# Patient Record
Sex: Female | Born: 1960 | Race: White | Hispanic: No | Marital: Married | Smoking: Current every day smoker
Health system: Southern US, Community
[De-identification: ages and names within clinical notes are randomized; demographics above are authoritative.]

## PROBLEM LIST (undated history)

## (undated) DIAGNOSIS — C801 Malignant (primary) neoplasm, unspecified: Secondary | ICD-10-CM

## (undated) DIAGNOSIS — I10 Essential (primary) hypertension: Secondary | ICD-10-CM

## (undated) DIAGNOSIS — K859 Acute pancreatitis without necrosis or infection, unspecified: Secondary | ICD-10-CM

## (undated) HISTORY — PX: SMALL INTESTINE SURGERY: SHX150

## (undated) HISTORY — PX: CHOLECYSTECTOMY: SHX55

## (undated) HISTORY — PX: ABDOMINAL SURGERY: SHX537

## (undated) HISTORY — PX: ABDOMINAL HYSTERECTOMY: SHX81

---

## 2010-01-25 ENCOUNTER — Emergency Department (HOSPITAL_COMMUNITY): Admission: EM | Admit: 2010-01-25 | Discharge: 2010-01-25 | Payer: Self-pay | Admitting: Emergency Medicine

## 2010-07-09 ENCOUNTER — Emergency Department (HOSPITAL_COMMUNITY)
Admission: EM | Admit: 2010-07-09 | Discharge: 2010-07-10 | Payer: Self-pay | Source: Home / Self Care | Admitting: Emergency Medicine

## 2012-04-10 ENCOUNTER — Other Ambulatory Visit (HOSPITAL_COMMUNITY): Payer: Self-pay | Admitting: Family Medicine

## 2012-04-10 DIAGNOSIS — M79606 Pain in leg, unspecified: Secondary | ICD-10-CM

## 2012-04-10 DIAGNOSIS — M545 Low back pain: Secondary | ICD-10-CM

## 2012-04-10 DIAGNOSIS — R32 Unspecified urinary incontinence: Secondary | ICD-10-CM

## 2012-04-17 ENCOUNTER — Ambulatory Visit (HOSPITAL_COMMUNITY)
Admission: RE | Admit: 2012-04-17 | Discharge: 2012-04-17 | Disposition: A | Discharge status: Home / Self Care | Payer: Medicaid Other | Source: Ambulatory Visit | Attending: Family Medicine | Admitting: Family Medicine

## 2012-04-17 DIAGNOSIS — M5126 Other intervertebral disc displacement, lumbar region: Secondary | ICD-10-CM | POA: Insufficient documentation

## 2012-04-17 DIAGNOSIS — R209 Unspecified disturbances of skin sensation: Secondary | ICD-10-CM | POA: Insufficient documentation

## 2012-04-17 DIAGNOSIS — R32 Unspecified urinary incontinence: Secondary | ICD-10-CM

## 2012-04-17 DIAGNOSIS — M545 Low back pain, unspecified: Secondary | ICD-10-CM | POA: Insufficient documentation

## 2012-04-17 DIAGNOSIS — M79606 Pain in leg, unspecified: Secondary | ICD-10-CM

## 2016-02-22 ENCOUNTER — Emergency Department (HOSPITAL_COMMUNITY)
Admission: EM | Admit: 2016-02-22 | Discharge: 2016-02-22 | Disposition: A | Discharge status: Home / Self Care | Payer: BLUE CROSS/BLUE SHIELD | Attending: Emergency Medicine | Admitting: Emergency Medicine

## 2016-02-22 ENCOUNTER — Encounter (HOSPITAL_COMMUNITY): Payer: Self-pay

## 2016-02-22 DIAGNOSIS — G8929 Other chronic pain: Secondary | ICD-10-CM | POA: Insufficient documentation

## 2016-02-22 DIAGNOSIS — S29012A Strain of muscle and tendon of back wall of thorax, initial encounter: Secondary | ICD-10-CM | POA: Insufficient documentation

## 2016-02-22 DIAGNOSIS — Z859 Personal history of malignant neoplasm, unspecified: Secondary | ICD-10-CM | POA: Insufficient documentation

## 2016-02-22 DIAGNOSIS — I10 Essential (primary) hypertension: Secondary | ICD-10-CM | POA: Insufficient documentation

## 2016-02-22 DIAGNOSIS — Y9289 Other specified places as the place of occurrence of the external cause: Secondary | ICD-10-CM | POA: Insufficient documentation

## 2016-02-22 DIAGNOSIS — S199XXA Unspecified injury of neck, initial encounter: Secondary | ICD-10-CM | POA: Diagnosis present

## 2016-02-22 DIAGNOSIS — S46811A Strain of other muscles, fascia and tendons at shoulder and upper arm level, right arm, initial encounter: Secondary | ICD-10-CM

## 2016-02-22 DIAGNOSIS — M5412 Radiculopathy, cervical region: Secondary | ICD-10-CM

## 2016-02-22 DIAGNOSIS — Y998 Other external cause status: Secondary | ICD-10-CM | POA: Insufficient documentation

## 2016-02-22 DIAGNOSIS — S0990XA Unspecified injury of head, initial encounter: Secondary | ICD-10-CM | POA: Insufficient documentation

## 2016-02-22 DIAGNOSIS — W11XXXA Fall on and from ladder, initial encounter: Secondary | ICD-10-CM | POA: Insufficient documentation

## 2016-02-22 DIAGNOSIS — Z79899 Other long term (current) drug therapy: Secondary | ICD-10-CM | POA: Diagnosis not present

## 2016-02-22 DIAGNOSIS — Y9389 Activity, other specified: Secondary | ICD-10-CM | POA: Insufficient documentation

## 2016-02-22 DIAGNOSIS — Z79818 Long term (current) use of other agents affecting estrogen receptors and estrogen levels: Secondary | ICD-10-CM | POA: Insufficient documentation

## 2016-02-22 DIAGNOSIS — S4991XA Unspecified injury of right shoulder and upper arm, initial encounter: Secondary | ICD-10-CM | POA: Insufficient documentation

## 2016-02-22 HISTORY — DX: Essential (primary) hypertension: I10

## 2016-02-22 HISTORY — DX: Malignant (primary) neoplasm, unspecified: C80.1

## 2016-02-22 MED ORDER — METHOCARBAMOL 500 MG PO TABS
1000.0000 mg | ORAL_TABLET | Freq: Once | ORAL | Status: AC
Start: 2016-02-22 — End: 2016-02-22
  Administered 2016-02-22: 1000 mg via ORAL
  Filled 2016-02-22: qty 2

## 2016-02-22 MED ORDER — METHOCARBAMOL 1000 MG/10ML IJ SOLN: 1000.0000 mg | Freq: Once | INTRAMUSCULAR | Status: DC

## 2016-02-22 MED ORDER — KETOROLAC TROMETHAMINE 60 MG/2ML IM SOLN
60.0000 mg | Freq: Once | INTRAMUSCULAR | Status: AC
  Administered 2016-02-22: 60 mg via INTRAMUSCULAR
  Filled 2016-02-22: qty 2

## 2016-02-22 MED ORDER — IBUPROFEN 800 MG PO TABS: 800.0000 mg | ORAL_TABLET | Freq: Four times a day (QID) | ORAL | Status: DC | PRN

## 2016-02-22 MED ORDER — METHOCARBAMOL 500 MG PO TABS: 500.0000 mg | ORAL_TABLET | Freq: Four times a day (QID) | ORAL | Status: DC | PRN

## 2016-02-22 NOTE — ED Provider Notes (Signed)
Patient reports she slipped on a attic ladder 2 days ago falling down 3 steps she complained of posterior neck pain onset several hours after the event. She feels much improved since treatment here and has no pain and feels that muscle spasm in her neck has resolved here on exam patient is alert no distress HEENT exam no facial asymmetry neck supple no tenderness neurologic motor strength 5 over 5 overall all 4 extremities with full range of motion  Orlie Dakin, MD 02/22/16 2127

## 2016-02-22 NOTE — ED Notes (Signed)
Dr. Ronnald Ramp at the bedside.

## 2016-02-22 NOTE — ED Notes (Signed)
Patient here with neck pain after slipping while coming out of attic, caught by spouse. Complains of posterior neck pain

## 2016-02-22 NOTE — ED Provider Notes (Signed)
CSN: [id number]     Arrival date & time 02/22/16  1413 History   First MD Initiated Contact with Patient 02/22/16 1911     Chief Complaint  Patient presents with  . Neck Pain     (Consider location/radiation/quality/duration/timing/severity/associated sxs/prior Treatment) HPI 55 y.o. female with a hx of chronic low back pain due to DDD and known mild cervical radiculopathy presents to the ED noting a recent slip down a few steps of a ladder 2 days ago. She denies actually falling to the ground, hitting her head, LOC, nor amnesia. She states that she grabbed the ladder suddenly and was able to brace herself when she slipped preventing a fall. She states that insidiously after this, however she began having right sided neck pain in the distribution of her trapezius. She takes chronic opioid pain medication and took this as rx'd, but had no significant improvement in her sx. Movement of her neck and right arm, and palpation makes it worse. She denies any associated radiculopathy, or focal neurologic deficits with this. She denies any midline pain in her C/T.L spine. She states that she still has her chronic low back pain but denies any worsening of it. She denies any other injuries associated with her fall.   Past Medical History  Diagnosis Date  . Hypertension   . Cancer [facility])    Past Surgical History  Procedure Laterality Date  . Abdominal hysterectomy     No family history on file. Social History  Substance Use Topics  . Smoking status: Never Smoker   . Smokeless tobacco: None  . Alcohol Use: None   OB History    No data available     Review of Systems  Constitutional: Positive for activity change. Negative for fever, chills and appetite change.  HENT: Negative for congestion and rhinorrhea.   Eyes: Negative for photophobia.  Respiratory: Negative for cough, chest tightness and shortness of breath.   Cardiovascular: Negative for chest pain.  Gastrointestinal: Negative for  nausea, vomiting, abdominal pain and diarrhea.  Genitourinary: Negative for dysuria, urgency, frequency, flank pain, enuresis and pelvic pain.  Musculoskeletal: Positive for back pain, neck pain and neck stiffness. Negative for myalgias and gait problem.  Skin: Negative for rash and wound.  Neurological: Positive for headaches. Negative for seizures, syncope, weakness, light-headedness and numbness.  All other systems reviewed and are negative.     Allergies  Bactrim  Home Medications   Prior to Admission medications   Medication Sig Start Date End Date Taking? Authorizing Provider  albuterol (PROVENTIL HFA;VENTOLIN HFA) 108 (90 Base) MCG/ACT inhaler Inhale 2-3 puffs into the lungs every 6 (six) hours as needed for wheezing or shortness of breath.   Yes Historical Provider, MD  ALPRAZolam Duanne Moron) 1 MG tablet Take 1 mg by mouth at bedtime as needed for anxiety.   Yes Historical Provider, MD  estradiol (ESTRACE) 0.5 MG tablet Take 0.5 mg by mouth daily.   Yes Historical Provider, MD  FLUoxetine (PROZAC) 20 MG capsule Take 20 mg by mouth daily.   Yes Historical Provider, MD  gabapentin (NEURONTIN) 300 MG capsule Take 300 mg by mouth 3 (three) times daily.   Yes Historical Provider, MD  lisinopril (PRINIVIL,ZESTRIL) 10 MG tablet Take 10 mg by mouth daily.   Yes Historical Provider, MD  oxyCODONE-acetaminophen (PERCOCET) 7.5-325 MG tablet Take 1 tablet by mouth 3 (three) times daily. Take every day per patient   Yes Historical Provider, MD  simvastatin (ZOCOR) 20 MG tablet Take 20 mg  by mouth every evening.   Yes Historical Provider, MD  varenicline (CHANTIX) 1 MG tablet Take 1 mg by mouth 2 (two) times daily.   Yes Historical Provider, MD  ibuprofen (ADVIL,MOTRIN) 800 MG tablet Take 1 tablet (800 mg total) by mouth every 6 (six) hours as needed for moderate pain. 02/22/16   Zenovia Jarred, DO  methocarbamol (ROBAXIN) 500 MG tablet Take 1 tablet (500 mg total) by mouth every 6 (six) hours as  needed for muscle spasms. 02/22/16   Zenovia Jarred, DO   BP 137/80 mmHg  Pulse 60  Temp(Src) 98 F (36.7 C) (Oral)  Resp 20  Ht 5\' 4"  (1.626 m)  Wt 73.936 kg  BMI 27.97 kg/m2  SpO2 97% Physical Exam  Constitutional: She is oriented to person, place, and time. She appears well-developed and well-nourished. No distress.  HENT:  Head: Normocephalic and atraumatic.  Nose: Nose normal.  Mouth/Throat: Oropharynx is clear and moist.  Eyes: Conjunctivae and EOM are normal. Pupils are equal, round, and reactive to light.  Neck: Normal range of motion. Neck supple. Muscular tenderness present. No spinous process tenderness present. Normal range of motion present.  + spurlings with radicular sx down her right arm, chronic. With old report of opt MRI at bedside showing mild foraminal stenosis in mid-cervical spine.   Cardiovascular: Normal rate, regular rhythm, normal heart sounds and intact distal pulses.   Pulmonary/Chest: Effort normal and breath sounds normal. She exhibits no tenderness.  Abdominal: Soft. There is no tenderness.  Musculoskeletal: She exhibits tenderness. She exhibits no edema.       Right shoulder: She exhibits tenderness and spasm. She exhibits no bony tenderness.       Arms: No C/T/L spine midline tenderness, crepitus or deformity.  Neurological: She is alert and oriented to person, place, and time. She has normal strength. No cranial nerve deficit or sensory deficit. Coordination and gait normal. GCS eye subscore is 4. GCS verbal subscore is 5. GCS motor subscore is 6.  Skin: Skin is warm and dry. No rash noted. She is not diaphoretic.  Nursing note and vitals reviewed.   ED Course  Procedures (including critical care time) Labs Review Labs Reviewed - No data to display  Imaging Review No results found. I have personally reviewed and evaluated these images and lab results as part of my medical decision-making.   EKG Interpretation None      MDM  55 y.o.  wirth hx of DDD, with known mild cervical stenosis presents to the ED noting right trapezius pain after she slipped down a few steps on a ladder 2 days with pain unalleviated by her chronic opiate pain medications for her chronic LBP. Physical exam reassuring with VSS, NAD. Tender to palpation in the distribution of the right trapezius with palpable muscle spasm compared to contralateral side. +spurlings on right. No focal neurologic deficit without axial loading of the c-spine however. NVI distal. No midline tenderness. Given insidious onset of pain a few hours after the fall with no bony or acute  neurologic deficits feel that her sx are likely due to a strain of her trapezius sustained when she slipped and caught herself before falling. Do not feel that advanced imaging is necessary at this time given reassuring exam, no hx of direct trauma, and recent MRI showing evidence of spinal stenosis to explain radiculopathy. She was treated in the ED with toradol and robaxin and had significant improvement in her sx. She was rx'd ibuprofen and robaxin  and was recommended to follow up with her PCP in about a week to monitor for improvement in her sx. This plan was discussed with the patient at the bedside and she stated both understanding and agreement with this plan.   Final diagnoses:  Trapezius strain, right, initial encounter  Cervical radiculopathy, chronic       Zenovia Jarred, DO 02/23/16 1242  Orlie Dakin, MD 02/24/16 [id number]

## 2016-02-22 NOTE — Discharge Instructions (Signed)
Cervical Radiculopathy Cervical radiculopathy means that a nerve in the neck is pinched or bruised. This can cause pain or loss of feeling (numbness) that runs from your neck to your arm and fingers. HOME CARE Managing Pain  Take over-the-counter and prescription medicines only as told by your doctor.  If directed, put ice on the injured or painful area.  Put ice in a plastic bag.  Place a towel between your skin and the bag.  Leave the ice on for 20 minutes, 2-3 times per day.  If ice does not help, you can try using heat. Take a warm shower or warm bath, or use a heat pack as told by your doctor.  You may try a gentle neck and shoulder massage. Activity  Rest as needed. Follow instructions from your doctor about any activities to avoid.  Do exercises as told by your doctor or physical therapist. General Instructions   If you were given a soft collar, wear it as told by your doctor.  Use a flat pillow when you sleep.  Keep all follow-up visits as told by your doctor. This is important. GET HELP IF:  Your condition does not improve with treatment. GET HELP RIGHT AWAY IF:   Your pain gets worse and is not controlled with medicine.  You lose feeling or feel weak in your hand, arm, face, or leg.  You have a fever.  You have a stiff neck.  You cannot control when you poop or pee (have incontinence).  You have trouble with walking, balance, or talking.   This information is not intended to replace advice given to you by your health care provider. Make sure you discuss any questions you have with your health care provider.   Document Released: 10/17/2011 Document Revised: 07/19/2015 Document Reviewed: 12/22/2014 Elsevier Interactive Patient Education 2016 [employer]  

## 2017-01-04 ENCOUNTER — Inpatient Hospital Stay (HOSPITAL_COMMUNITY)
Admission: AD | Admit: 2017-01-04 | Discharge: 2017-01-07 | DRG: 440 | Disposition: A | Discharge status: Home / Self Care | Payer: Medicaid Other | Source: Other Acute Inpatient Hospital | Attending: Internal Medicine | Admitting: Internal Medicine

## 2017-01-04 ENCOUNTER — Telehealth: Payer: Self-pay | Admitting: Internal Medicine

## 2017-01-04 DIAGNOSIS — R0600 Dyspnea, unspecified: Secondary | ICD-10-CM

## 2017-01-04 DIAGNOSIS — E876 Hypokalemia: Secondary | ICD-10-CM | POA: Diagnosis present

## 2017-01-04 DIAGNOSIS — J449 Chronic obstructive pulmonary disease, unspecified: Secondary | ICD-10-CM | POA: Diagnosis present

## 2017-01-04 DIAGNOSIS — K219 Gastro-esophageal reflux disease without esophagitis: Secondary | ICD-10-CM | POA: Diagnosis present

## 2017-01-04 DIAGNOSIS — K838 Other specified diseases of biliary tract: Secondary | ICD-10-CM

## 2017-01-04 DIAGNOSIS — Z90710 Acquired absence of both cervix and uterus: Secondary | ICD-10-CM

## 2017-01-04 DIAGNOSIS — Z87891 Personal history of nicotine dependence: Secondary | ICD-10-CM

## 2017-01-04 DIAGNOSIS — K851 Biliary acute pancreatitis without necrosis or infection: Secondary | ICD-10-CM | POA: Diagnosis present

## 2017-01-04 DIAGNOSIS — I10 Essential (primary) hypertension: Secondary | ICD-10-CM | POA: Diagnosis present

## 2017-01-04 DIAGNOSIS — R7989 Other specified abnormal findings of blood chemistry: Secondary | ICD-10-CM | POA: Diagnosis not present

## 2017-01-04 DIAGNOSIS — E785 Hyperlipidemia, unspecified: Secondary | ICD-10-CM | POA: Diagnosis present

## 2017-01-04 DIAGNOSIS — K859 Acute pancreatitis without necrosis or infection, unspecified: Secondary | ICD-10-CM | POA: Diagnosis present

## 2017-01-04 DIAGNOSIS — F418 Other specified anxiety disorders: Secondary | ICD-10-CM | POA: Diagnosis present

## 2017-01-04 DIAGNOSIS — R112 Nausea with vomiting, unspecified: Secondary | ICD-10-CM | POA: Diagnosis present

## 2017-01-04 DIAGNOSIS — Z9049 Acquired absence of other specified parts of digestive tract: Secondary | ICD-10-CM | POA: Diagnosis not present

## 2017-01-04 DIAGNOSIS — R748 Abnormal levels of other serum enzymes: Secondary | ICD-10-CM | POA: Diagnosis not present

## 2017-01-04 NOTE — Telephone Encounter (Signed)
Called by Dr. Colin Rhein at [facility] ED.  56 yo F with h/o cholecystectomy.  CT abd pelvis showed no stone nor bile duct dilation.  Lipase is 1042, AST 160, ALT 223, ALK 199.  Suspect retained gallstone causing pancreatitis?  Not a drinker.  Vital signs BP 116/67, pulse 63, patient going to med surg inpatient.  EDP spoke with Dr. Simona Huh with [facility] who will see patient in consult (presumably in AM).  No fever, chills, WBC 4.6.

## 2017-01-05 ENCOUNTER — Inpatient Hospital Stay (HOSPITAL_COMMUNITY): Payer: Medicaid Other

## 2017-01-05 ENCOUNTER — Encounter (HOSPITAL_COMMUNITY): Payer: Self-pay | Admitting: Family Medicine

## 2017-01-05 DIAGNOSIS — K859 Acute pancreatitis without necrosis or infection, unspecified: Secondary | ICD-10-CM | POA: Diagnosis present

## 2017-01-05 DIAGNOSIS — E876 Hypokalemia: Secondary | ICD-10-CM | POA: Diagnosis present

## 2017-01-05 DIAGNOSIS — K219 Gastro-esophageal reflux disease without esophagitis: Secondary | ICD-10-CM

## 2017-01-05 DIAGNOSIS — E785 Hyperlipidemia, unspecified: Secondary | ICD-10-CM

## 2017-01-05 DIAGNOSIS — J449 Chronic obstructive pulmonary disease, unspecified: Secondary | ICD-10-CM | POA: Diagnosis present

## 2017-01-05 DIAGNOSIS — F418 Other specified anxiety disorders: Secondary | ICD-10-CM | POA: Diagnosis present

## 2017-01-05 DIAGNOSIS — I10 Essential (primary) hypertension: Secondary | ICD-10-CM | POA: Diagnosis present

## 2017-01-05 DIAGNOSIS — R7989 Other specified abnormal findings of blood chemistry: Secondary | ICD-10-CM

## 2017-01-05 HISTORY — DX: Chronic obstructive pulmonary disease, unspecified: J44.9

## 2017-01-05 HISTORY — DX: Other specified anxiety disorders: F41.8

## 2017-01-05 HISTORY — DX: Gastro-esophageal reflux disease without esophagitis: K21.9

## 2017-01-05 HISTORY — DX: Hyperlipidemia, unspecified: E78.5

## 2017-01-05 LAB — LIPASE, BLOOD: LIPASE: 66 U/L — AB (ref 11–51)

## 2017-01-05 LAB — COMPREHENSIVE METABOLIC PANEL
ALK PHOS: 168 U/L — AB (ref 38–126)
ALT: 172 U/L — AB (ref 14–54)
ANION GAP: 9 (ref 5–15)
AST: 113 U/L — ABNORMAL HIGH (ref 15–41)
Albumin: 3.2 g/dL — ABNORMAL LOW (ref 3.5–5.0)
BUN: 6 mg/dL (ref 6–20)
CALCIUM: 8.3 mg/dL — AB (ref 8.9–10.3)
CO2: 26 mmol/L (ref 22–32)
CREATININE: 0.74 mg/dL (ref 0.44–1.00)
Chloride: 104 mmol/L (ref 101–111)
Glucose, Bld: 104 mg/dL — ABNORMAL HIGH (ref 65–99)
Potassium: 3.7 mmol/L (ref 3.5–5.1)
Sodium: 139 mmol/L (ref 135–145)
Total Bilirubin: 1.6 mg/dL — ABNORMAL HIGH (ref 0.3–1.2)
Total Protein: 5.7 g/dL — ABNORMAL LOW (ref 6.5–8.1)

## 2017-01-05 LAB — CBC WITH DIFFERENTIAL/PLATELET
BASOS PCT: 0 %
Basophils Absolute: 0 10*3/uL (ref 0.0–0.1)
EOS ABS: 0.2 10*3/uL (ref 0.0–0.7)
Eosinophils Relative: 3 %
HCT: 37.8 % (ref 36.0–46.0)
HEMOGLOBIN: 12.9 g/dL (ref 12.0–15.0)
Lymphocytes Relative: 22 %
Lymphs Abs: 1.3 10*3/uL (ref 0.7–4.0)
MCH: 31.2 pg (ref 26.0–34.0)
MCHC: 34.1 g/dL (ref 30.0–36.0)
MCV: 91.5 fL (ref 78.0–100.0)
Monocytes Absolute: 0.9 10*3/uL (ref 0.1–1.0)
Monocytes Relative: 15 %
NEUTROS PCT: 60 %
Neutro Abs: 3.6 10*3/uL (ref 1.7–7.7)
Platelets: 217 10*3/uL (ref 150–400)
RBC: 4.13 MIL/uL (ref 3.87–5.11)
RDW: 12.4 % (ref 11.5–15.5)
WBC: 5.9 10*3/uL (ref 4.0–10.5)

## 2017-01-05 LAB — TRIGLYCERIDES: Triglycerides: 68 mg/dL (ref <150)

## 2017-01-05 LAB — MAGNESIUM: MAGNESIUM: 2 mg/dL (ref 1.7–2.4)

## 2017-01-05 LAB — HIV ANTIBODY (ROUTINE TESTING W REFLEX): HIV Screen 4th Generation wRfx: NONREACTIVE

## 2017-01-05 MED ORDER — NICOTINE 14 MG/24HR TD PT24
14.0000 mg | MEDICATED_PATCH | Freq: Every day | TRANSDERMAL | Status: DC
  Administered 2017-01-05 – 2017-01-07 (×3): 14 mg via TRANSDERMAL
  Filled 2017-01-05 (×4): qty 1

## 2017-01-05 MED ORDER — ALUM & MAG HYDROXIDE-SIMETH 200-200-20 MG/5ML PO SUSP: 30.0000 mL | ORAL | Status: DC | PRN

## 2017-01-05 MED ORDER — ONDANSETRON HCL 4 MG PO TABS: 4.0000 mg | ORAL_TABLET | Freq: Four times a day (QID) | ORAL | Status: DC | PRN

## 2017-01-05 MED ORDER — MORPHINE SULFATE (PF) 2 MG/ML IV SOLN
2.0000 mg | INTRAVENOUS | Status: DC | PRN
  Administered 2017-01-05 (×2): 2 mg via INTRAVENOUS
  Administered 2017-01-05: 4 mg via INTRAVENOUS
  Administered 2017-01-06 (×2): 2 mg via INTRAVENOUS
  Filled 2017-01-05 (×3): qty 1
  Filled 2017-01-05: qty 2
  Filled 2017-01-05: qty 1

## 2017-01-05 MED ORDER — CALCIUM CARBONATE ANTACID 500 MG PO CHEW
1.0000 | CHEWABLE_TABLET | Freq: Once | ORAL | Status: AC
  Administered 2017-01-05: 200 mg via ORAL
  Filled 2017-01-05: qty 1

## 2017-01-05 MED ORDER — FLUOXETINE HCL 20 MG PO CAPS
20.0000 mg | ORAL_CAPSULE | Freq: Every day | ORAL | Status: DC
  Administered 2017-01-05 – 2017-01-07 (×3): 20 mg via ORAL
  Filled 2017-01-05 (×3): qty 1

## 2017-01-05 MED ORDER — ALBUTEROL SULFATE (2.5 MG/3ML) 0.083% IN NEBU: 3.0000 mL | INHALATION_SOLUTION | Freq: Four times a day (QID) | RESPIRATORY_TRACT | Status: DC | PRN

## 2017-01-05 MED ORDER — LISINOPRIL 10 MG PO TABS
10.0000 mg | ORAL_TABLET | Freq: Every day | ORAL | Status: DC
  Administered 2017-01-05: 10 mg via ORAL
  Filled 2017-01-05: qty 1

## 2017-01-05 MED ORDER — ACETAMINOPHEN 650 MG RE SUPP: 650.0000 mg | Freq: Four times a day (QID) | RECTAL | Status: DC | PRN

## 2017-01-05 MED ORDER — SODIUM CHLORIDE 0.9 % IV SOLN
INTRAVENOUS | Status: DC
  Administered 2017-01-05: 01:00:00 via INTRAVENOUS

## 2017-01-05 MED ORDER — ALUM & MAG HYDROXIDE-SIMETH 200-200-20 MG/5ML PO SUSP
15.0000 mL | Freq: Once | ORAL | Status: AC
  Administered 2017-01-05: 15 mL via ORAL
  Filled 2017-01-05: qty 30

## 2017-01-05 MED ORDER — ENOXAPARIN SODIUM 40 MG/0.4ML ~~LOC~~ SOLN
40.0000 mg | SUBCUTANEOUS | Status: DC
  Administered 2017-01-05 – 2017-01-07 (×3): 40 mg via SUBCUTANEOUS
  Filled 2017-01-05 (×4): qty 0.4

## 2017-01-05 MED ORDER — PROMETHAZINE HCL 25 MG/ML IJ SOLN
12.5000 mg | Freq: Four times a day (QID) | INTRAMUSCULAR | Status: DC | PRN
  Administered 2017-01-05: 12.5 mg via INTRAVENOUS
  Filled 2017-01-05: qty 1

## 2017-01-05 MED ORDER — OXYCODONE-ACETAMINOPHEN 7.5-325 MG PO TABS
1.0000 | ORAL_TABLET | Freq: Three times a day (TID) | ORAL | Status: DC | PRN
  Administered 2017-01-05 – 2017-01-06 (×4): 1 via ORAL
  Filled 2017-01-05 (×5): qty 1

## 2017-01-05 MED ORDER — ONDANSETRON HCL 4 MG/2ML IJ SOLN
4.0000 mg | Freq: Four times a day (QID) | INTRAMUSCULAR | Status: DC | PRN
  Administered 2017-01-05 – 2017-01-06 (×4): 4 mg via INTRAVENOUS
  Filled 2017-01-05 (×4): qty 2

## 2017-01-05 MED ORDER — GABAPENTIN 300 MG PO CAPS
300.0000 mg | ORAL_CAPSULE | Freq: Three times a day (TID) | ORAL | Status: DC
Start: 2017-01-05 — End: 2017-01-07
  Administered 2017-01-05 – 2017-01-07 (×7): 300 mg via ORAL
  Filled 2017-01-05 (×7): qty 1

## 2017-01-05 MED ORDER — ALPRAZOLAM 0.5 MG PO TABS: 1.0000 mg | ORAL_TABLET | Freq: Every evening | ORAL | Status: DC | PRN

## 2017-01-05 MED ORDER — GADOBENATE DIMEGLUMINE 529 MG/ML IV SOLN
15.0000 mL | Freq: Once | INTRAVENOUS | Status: AC | PRN
Start: 2017-01-05 — End: 2017-01-05
  Administered 2017-01-05: 15 mL via INTRAVENOUS

## 2017-01-05 MED ORDER — ACETAMINOPHEN 325 MG PO TABS
650.0000 mg | ORAL_TABLET | Freq: Four times a day (QID) | ORAL | Status: DC | PRN
  Administered 2017-01-05 – 2017-01-07 (×2): 650 mg via ORAL
  Filled 2017-01-05 (×2): qty 2

## 2017-01-05 MED ORDER — FAMOTIDINE IN NACL 20-0.9 MG/50ML-% IV SOLN
20.0000 mg | Freq: Two times a day (BID) | INTRAVENOUS | Status: DC
  Administered 2017-01-05 – 2017-01-06 (×4): 20 mg via INTRAVENOUS
  Filled 2017-01-05 (×5): qty 50

## 2017-01-05 MED ORDER — METHOCARBAMOL 500 MG PO TABS
500.0000 mg | ORAL_TABLET | Freq: Four times a day (QID) | ORAL | Status: DC | PRN
  Administered 2017-01-06 (×3): 500 mg via ORAL
  Filled 2017-01-05 (×3): qty 1

## 2017-01-05 MED ORDER — SODIUM CHLORIDE 0.9 % IV SOLN
INTRAVENOUS | Status: DC
  Administered 2017-01-05 – 2017-01-07 (×4): via INTRAVENOUS

## 2017-01-05 NOTE — H&P (Addendum)
History and Physical    Erin Prince:[id number] DOB: 01-18-1961 DOA: 01/04/2017  PCP: [name], MD   Patient coming from: Home, by way of [facility]   Chief Complaint: Nausea, vomiting, abdominal pain   HPI: Erin Prince is a 56 y.o. female with medical history significant for hypertension, depression, anxiety, hyperlipidemia, GERD, and chronic lower abdominal pain and presents in transfer from [facility] where she was seen for nausea, vomiting, and abdominal pain. Patient reports that she was in her usual state of health until yesterday evening when she developed acute onset of nausea with nonbloody nonbilious vomiting. She reports being kept awake all night with severe nausea and recurrent vomiting. She endorses associated abdominal pain, periumbilically and in the lower quadrants, but describes this as consistent with her chronic abdominal pain. There has been no fevers or chills and she has not had a cough, dyspnea, chest pain, or palpitations. There has been a sore throat and marked increase in her acid reflux symptoms. Patient has history of cholecystectomy 3 or 4 years ago, denies any alcohol use, denies any new medications, denies abdominal trauma, and denies any history of pancreatitis.   [place] ED Course: Upon arrival to the [facility] ED, patient is found to be afebrile, saturating well on room air, and with vital signs stable. Chemistry panel was notable for mild hypokalemia, alkaline phosphatase of 199, AST 166, ALT 223, and total bilirubin of 2.0. CBC was unremarkable and rapid strep testing was negative. Urinalysis is unremarkable and lipase is elevated to a value of 1042. CT of the abdomen and pelvis was obtained and negative for any acute intra-abdominal or intrapelvic pathology. CT is notable for a periumbilical hernia with small bowel, but no obstruction, and also changes consistent with prior cholecystectomy and intra-and extrahepatic biliary  dilation which is slightly increased from last August, but decreased from the prior October. Patient was treated with 2 L of normal saline, Tylenol, Zofran, morphine, and 40 mEq oral potassium in the ED. There was no gastroenterology coverage available at the outside hospital and the case was discussed with GI here at [facility] who accepted the patient in consultation.  Patient is interviewed and examined on the medical/surgical unit [facility] where she remains hemodynamically stable and in no apparent respiratory distress and will be admitted for ongoing evaluation and management of acute pancreatitis.  Review of Systems:  All other systems reviewed and apart from HPI, are negative.  Past Medical History:  Diagnosis Date  . Cancer ([place])   . Hypertension     Past Surgical History:  Procedure Laterality Date  . ABDOMINAL HYSTERECTOMY    . CHOLECYSTECTOMY       reports that she has quit smoking. She has never used smokeless tobacco. Her alcohol and drug histories are not on file.  Allergies  Allergen Reactions  . Bactrim [Sulfamethoxazole-Trimethoprim]     Anaphylaxis      Family History  Problem Relation Age of Onset  . Pancreatic disease Neg Hx      Prior to Admission medications   Medication Sig Start Date End Date Taking? Authorizing Provider  albuterol (PROVENTIL HFA;VENTOLIN HFA) 108 (90 Base) MCG/ACT inhaler Inhale 2-3 puffs into the lungs every 6 (six) hours as needed for wheezing or shortness of breath.    Historical Provider, MD  ALPRAZolam Duanne Moron) 1 MG tablet Take 1 mg by mouth at bedtime as needed for anxiety.    Historical Provider, MD  estradiol (ESTRACE) 0.5  MG tablet Take 0.5 mg by mouth daily.    Historical Provider, MD  FLUoxetine (PROZAC) 20 MG capsule Take 20 mg by mouth daily.    Historical Provider, MD  gabapentin (NEURONTIN) 300 MG capsule Take 300 mg by mouth 3 (three) times daily.    Historical Provider, MD  ibuprofen (ADVIL,MOTRIN)  800 MG tablet Take 1 tablet (800 mg total) by mouth every 6 (six) hours as needed for moderate pain. 02/22/16   Zenovia Jarred, DO  lisinopril (PRINIVIL,ZESTRIL) 10 MG tablet Take 10 mg by mouth daily.    Historical Provider, MD  methocarbamol (ROBAXIN) 500 MG tablet Take 1 tablet (500 mg total) by mouth every 6 (six) hours as needed for muscle spasms. 02/22/16   Zenovia Jarred, DO  oxyCODONE-acetaminophen (PERCOCET) 7.5-325 MG tablet Take 1 tablet by mouth 3 (three) times daily. Take every day per patient    Historical Provider, MD  simvastatin (ZOCOR) 20 MG tablet Take 20 mg by mouth every evening.    Historical Provider, MD  varenicline (CHANTIX) 1 MG tablet Take 1 mg by mouth 2 (two) times daily.    Historical Provider, MD    Physical Exam: Vitals:   01/04/17 2352  BP: 138/78  Pulse: 68  Temp: 97.9 F (36.6 C)  TempSrc: Oral  SpO2: 97%      Constitutional: NAD, calm, in apparent discomfort  Eyes: PERTLA, lids and conjunctivae normal ENMT: Mucous membranes are moist. Posterior pharynx clear of any exudate or lesions.   Neck: normal, supple, no masses, no thyromegaly Respiratory: clear to auscultation bilaterally, no wheezing, no crackles. Normal respiratory effort.   Cardiovascular: S1 & S2 heard, regular rate and rhythm, soft systolic murmur at upper SB. No significant JVD. Abdomen: No distension, mildly tender in lower quadrants, tender in RUQ, no rebound pain or guarding, no masses palpated. Bowel sounds active.  Musculoskeletal: no clubbing / cyanosis. No joint deformity upper and lower extremities.   Skin: no significant rashes, lesions, ulcers. Warm, dry, well-perfused. Neurologic: CN 2-12 grossly intact. Sensation intact, DTR normal. Strength 5/5 in all 4 limbs.  Psychiatric: Normal judgment and insight. Alert and oriented x 3. Normal mood and affect.     Labs on Admission: I have personally reviewed following labs and imaging studies  CBC: No results for input(s): WBC,  NEUTROABS, HGB, HCT, MCV, PLT in the last 168 hours. Basic Metabolic Panel: No results for input(s): NA, K, CL, CO2, GLUCOSE, BUN, CREATININE, CALCIUM, MG, PHOS in the last 168 hours. GFR: CrCl cannot be calculated (No order found.). Liver Function Tests: No results for input(s): AST, ALT, ALKPHOS, BILITOT, PROT, ALBUMIN in the last 168 hours. No results for input(s): LIPASE, AMYLASE in the last 168 hours. No results for input(s): AMMONIA in the last 168 hours. Coagulation Profile: No results for input(s): INR, PROTIME in the last 168 hours. Cardiac Enzymes: No results for input(s): CKTOTAL, CKMB, CKMBINDEX, TROPONINI in the last 168 hours. BNP (last 3 results) No results for input(s): PROBNP in the last 8760 hours. HbA1C: No results for input(s): HGBA1C in the last 72 hours. CBG: No results for input(s): GLUCAP in the last 168 hours. Lipid Profile: No results for input(s): CHOL, HDL, LDLCALC, TRIG, CHOLHDL, LDLDIRECT in the last 72 hours. Thyroid Function Tests: No results for input(s): TSH, T4TOTAL, FREET4, T3FREE, THYROIDAB in the last 72 hours. Anemia Panel: No results for input(s): VITAMINB12, FOLATE, FERRITIN, TIBC, IRON, RETICCTPCT in the last 72 hours. Urine analysis: No results found for: COLORURINE, APPEARANCEUR,  LABSPEC, PHURINE, GLUCOSEU, HGBUR, BILIRUBINUR, KETONESUR, PROTEINUR, UROBILINOGEN, NITRITE, LEUKOCYTESUR Sepsis Labs: @LABRCNTIP (procalcitonin:4,lacticidven:4) )No results found for this or any previous visit (from the past 240 hour(s)).   Radiological Exams on Admission: No results found.  EKG: Not performed, will obtain as appropriate.  Assessment/Plan  1. Acute pancreatitis - Pt presents with acute abdominal pain, nausea and vomiting - Labs reveal lipase 1042, alk phos 199, AST 160, ALT 223, and total bili 2.0  - CT abd/pelv without acute pathology; she is s/p cholecystectomy 3-4 years ago - She denies EtOH use, is not on any of the typically  implicated medications, serum calcium wnl, and takes statin for HLD  - GI is consulting and much appreciated  - She was given 2 liters NS, APAP, morphine 4 mg IV, and Zofran at [place] to continue IVF hydration, antiemetics, pain-control, check triglycerides, follow-up GI recommedations   2. GERD - No EGD report on file  - Managed with daily Protonix at home - Treat with IV Pepcid q12h for now    3. Hypertension  - BP at goal  - Continue lisinopril as tolerated    4. COPD  - Stable on admission, no exacerbation  - Continue prn albuterol    5. Depression, anxiety  - Appears to be stable - Continue current management with Prozac, prn Xanax    6. Hyperlipidemia  - Check triglycerides as above  - Hold Zocor given acutely elevated LFT's    7. Hypokalemia  - Serum potassium 3.2 at outside hospital and was treated with 40 mEq oral potassium  - Check chemistries with mag level and replete further prn    DVT prophylaxis: sq Lovenox  Code Status: Full  Family Communication: Husband and son updated at bedside with patient's permission Disposition Plan: Admit to med-surg  Consults called: GI  Admission status: Inpatient    Erin Bulls, MD [facility] Pager [phone]  If 7PM-7AM, please contact night-coverage [email or web address] Password [email or web address]  01/05/2017, 1:06 AM

## 2017-01-05 NOTE — Progress Notes (Signed)
Pt seen and examined, admitted earlier this am by Dr.Opyd 55/F with hypertension, depression, anxiety, hyperlipidemia, GERD, and chronic lower abdominal pain and presented in transfer from [facility] where she was seen for nausea, vomiting, and abdominal pain. In [place] found to have abn LFTs AST 166, ALT 223, ALP 199, lipase 1042, CT abd no acute abnormality, prior cholecystectomy and intra-and extrahepatic biliary dilation which is slightly increased from last August. Feels better, LFTs improving, lipase 66 now 1. Nausea/VOmiting/Abd pain: ? Choledocholithiases/mild gall stone pancreatitis -improving, suspect passed stone, denies ETOh abuse -continue IVF, clears, supportive care -[facility] consulted Rest of medical issues stable  Domenic Polite, MD

## 2017-01-05 NOTE — Consult Note (Signed)
Referring Provider:  Dr. Myna Hidalgo Primary Care Physician:  [name], MD Primary Gastroenterologist:  Althia Forts  Reason for Consultation:  Elevated LFT's and abdominal pain, pancreatitis  HPI: Erin Prince is a 56 y.o. 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  She was transferred to [facility] from [facility] where she was seen for nausea, vomiting, diarrhea, and abdominal pain. Patient reports that she was in her usual state of health until the day prior to admission when she developed acute onset of fatigue, nausea with nonbloody nonbilious vomiting, which was then followed by diarrhea. She reports being kept awake all night with severe nausea and recurrent vomiting. She endorses associated abdominal pain, periumbilically and in the lower quadrants, but describes this as consistent with her chronic abdominal pain, but maybe slightly worse. There has been no fevers or chills and she has not had a cough, dyspnea, chest pain, or palpitations. There has been a sore throat and marked increase in her acid reflux symptoms. Patient has history of cholecystectomy 3 or 4 years ago or "sludge" in her gallbladder.  Denies any alcohol use, denies any new medications, denies abdominal trauma, and denies any history of pancreatitis.  Has been on Zocor and Lisinopril for about one year.  [place] ED Course: Upon arrival to the [facility] ED, patient was found to be afebrile, saturating well on room air, and with vital signs stable. Chemistry panel was notable for mild hypokalemia, alkaline phosphatase of 199, AST 166, ALT 223, and total bilirubin of 2.0. CBC was unremarkable and rapid strep testing was negative. Urinalysis is unremarkable and lipase is elevated to a value of 1042. CT of the abdomen and pelvis was obtained and negative for any acute  intra-abdominal or intrapelvic pathology. CT is notable for a periumbilical hernia with small bowel, but no obstruction, and also changes consistent with prior cholecystectomy and intra-and extrahepatic biliary dilation which is slightly increased from last August, but decreased from the prior October. Patient was treated with 2 L of normal saline, Tylenol, Zofran, morphine, and 40 mEq oral potassium in the ED. There was no gastroenterology coverage available at the outside hospital so the patient was transferred to [facility] hospital.  This AM her lipase has almost normalized to 66.  It appears that her LFT's are coming down as well.  Triglycerides are normal and she denies ETOH use.  Has been on Zocor and lisinopril for about the past year.  She is feeling much better this AM.  No further nausea, vomiting, diarrhea.  Abdominal pain improved but still present, c/w her usually pain.   Past Medical History:  Diagnosis Date  . Cancer ([place])   . Hypertension     Past Surgical History:  Procedure Laterality Date  . ABDOMINAL HYSTERECTOMY    . CHOLECYSTECTOMY      Prior to Admission medications   Medication Sig Start Date End Date Taking? Authorizing Provider  albuterol (PROVENTIL HFA;VENTOLIN HFA) 108 (90 Base) MCG/ACT inhaler Inhale 2-3 puffs into the lungs every 6 (six) hours as needed for wheezing or shortness of breath.    Historical Provider, MD  ALPRAZolam Duanne Moron) 1 MG tablet Take 1 mg by mouth at bedtime as needed for anxiety.    Historical Provider, MD  estradiol (ESTRACE) 0.5 MG tablet Take 0.5 mg by mouth daily.    Historical Provider, MD  FLUoxetine (PROZAC) 20 MG capsule Take 20  mg by mouth daily.    Historical Provider, MD  gabapentin (NEURONTIN) 300 MG capsule Take 300 mg by mouth 3 (three) times daily.    Historical Provider, MD  ibuprofen (ADVIL,MOTRIN) 800 MG tablet Take 1 tablet (800 mg total) by mouth every 6 (six) hours as needed for moderate pain. 02/22/16   Zenovia Jarred, DO    lisinopril (PRINIVIL,ZESTRIL) 10 MG tablet Take 10 mg by mouth daily.    Historical Provider, MD  methocarbamol (ROBAXIN) 500 MG tablet Take 1 tablet (500 mg total) by mouth every 6 (six) hours as needed for muscle spasms. 02/22/16   Zenovia Jarred, DO  oxyCODONE-acetaminophen (PERCOCET) 7.5-325 MG tablet Take 1 tablet by mouth 3 (three) times daily. Take every day per patient    Historical Provider, MD  simvastatin (ZOCOR) 20 MG tablet Take 20 mg by mouth every evening.    Historical Provider, MD  varenicline (CHANTIX) 1 MG tablet Take 1 mg by mouth 2 (two) times daily.    Historical Provider, MD    Current Facility-Administered Medications  Medication Dose Route Frequency Provider Last Rate Last Dose  . 0.9 %  sodium chloride infusion   Intravenous Continuous Domenic Polite, MD 100 mL/hr at 01/05/17 0847    . acetaminophen (TYLENOL) tablet 650 mg  650 mg Oral Q6H PRN Vianne Bulls, MD   650 mg at 01/05/17 0124   Or  . acetaminophen (TYLENOL) suppository 650 mg  650 mg Rectal Q6H PRN Ilene Qua Opyd, MD      . albuterol (PROVENTIL) (2.5 MG/3ML) 0.083% nebulizer solution 3 mL  3 mL Inhalation Q6H PRN Vianne Bulls, MD      . ALPRAZolam Duanne Moron) tablet 1 mg  1 mg Oral QHS PRN Ilene Qua Opyd, MD      . enoxaparin (LOVENOX) injection 40 mg  40 mg Subcutaneous Q24H Ilene Qua Opyd, MD      . famotidine (PEPCID) IVPB 20 mg premix  20 mg Intravenous Q12H Vianne Bulls, MD   20 mg at 01/05/17 0847  . FLUoxetine (PROZAC) capsule 20 mg  20 mg Oral Daily Vianne Bulls, MD   20 mg at 01/05/17 0847  . gabapentin (NEURONTIN) capsule 300 mg  300 mg Oral TID Vianne Bulls, MD   300 mg at 01/05/17 0846  . lisinopril (PRINIVIL,ZESTRIL) tablet 10 mg  10 mg Oral Daily Vianne Bulls, MD   10 mg at 01/05/17 0848  . methocarbamol (ROBAXIN) tablet 500 mg  500 mg Oral Q6H PRN Ilene Qua Opyd, MD      . morphine 2 MG/ML injection 2-4 mg  2-4 mg Intravenous Q4H PRN Vianne Bulls, MD   2 mg at 01/05/17 0442  .  ondansetron (ZOFRAN) tablet 4 mg  4 mg Oral Q6H PRN Vianne Bulls, MD       Or  . ondansetron (ZOFRAN) injection 4 mg  4 mg Intravenous Q6H PRN Vianne Bulls, MD   4 mg at 01/05/17 0846  . oxyCODONE-acetaminophen (PERCOCET) 7.5-325 MG per tablet 1 tablet  1 tablet Oral Q8H PRN Vianne Bulls, MD   1 tablet at 01/05/17 0846    Allergies as of 01/04/2017 - Review Complete 02/22/2016  Allergen Reaction Noted  . Bactrim [sulfamethoxazole-trimethoprim]  02/22/2016    Family History  Problem Relation Age of Onset  . Pancreatic disease Neg Hx     Social History   Social History  . Marital status: Married  Spouse name: N/A  . Number of children: N/A  . Years of education: N/A   Occupational History  . Not on file.   Social History Main Topics  . Smoking status: Former Research scientist (life sciences)  . Smokeless tobacco: Never Used  . Alcohol use Not on file  . Drug use: Unknown  . Sexual activity: Not on file   Other Topics Concern  . Not on file   Social History Narrative  . No narrative on file   Review of Systems: ROS negative except as mentioned in HPI.  Physical Exam: Vital signs in last 24 hours: Temp:  [97.7 F (36.5 C)-98 F (36.7 C)] 98 F (36.7 C) (02/25 0853) Pulse Rate:  [63-68] 63 (02/25 0853) Resp:  [18] 18 (02/25 0853) BP: (105-138)/(59-78) 108/59 (02/25 0853) SpO2:  [97 %-99 %] 99 % (02/25 0853) Last BM Date: 01/04/17 General:  Alert, Well-developed, well-nourished, pleasant and cooperative in NAD Head:  Normocephalic and atraumatic. Eyes:  Sclera clear, no icterus.  Conjunctiva pink. Ears:  Normal auditory acuity. Mouth:  No deformity or lesions.   Lungs:  Clear throughout to auscultation.  No wheezes, crackles, or rhonchi.  No increased WOB. Heart:  Regular rate and rhythm; no murmurs, clicks, rubs, or gallops.  Abdomen:  Soft, non-distended.  BS present.  Minimal TTP.  Laparotomy scar noted. Rectal:  Deferred  Msk:  Symmetrical without gross deformities. Pulses:   Normal pulses noted. Extremities:  Without clubbing or edema. Neurologic:  Alert and oriented x 4;  grossly normal neurologically. Skin:  Intact without significant lesions or rashes. Psych:  Alert and cooperative. Normal mood and affect.  Lab Results:  Recent Labs  01/05/17 0213  WBC 5.9  HGB 12.9  HCT 37.8  PLT 217   BMET  Recent Labs  01/05/17 0213  NA 139  K 3.7  CL 104  CO2 26  GLUCOSE 104*  BUN 6  CREATININE 0.74  CALCIUM 8.3*   LFT  Recent Labs  01/05/17 0213  PROT 5.7*  ALBUMIN 3.2*  AST 113*  ALT 172*  ALKPHOS 168*  BILITOT 1.6*   IMPRESSION:  -56 year old female with nausea, vomiting, diarrhea, and abdominal pain with elevated lipase and liver enzymes:  Lipase quickly normalized and LFT's trending down as well.  No CT scan findings C/W pancreatitis, but does have some biliary duct dilation.  Gallbladder removed for "sludge" 3-4 years ago.  Denies ETOH.  Triglycerides normal.  On Zocor and lisinopril for about the past year.  PLAN: -Will order MRI abdomen/MRCP to rule out bile duct stone and further evaluate biliary dilation. -Continue to trend LFT's. -O/W continue supportive care.  ZEHR, JESSICA D.  01/05/2017, 8:58 AM  Pager number [phone]  I have reviewed the entire case in detail with the above APP and discussed the plan in detail.  Therefore, I agree with the diagnoses recorded above. In addition,  I have personally interviewed and examined the patient and have personally reviewed any abdominal/pelvic CT scan images.  My additional thoughts are as follows:  Probable biliary pancreatitis.  Was probably going omn at home several days PTA by her report. Elevated LFTs, stable  Looks well, lipase has normalized.  MRCP to see if ERCP needed, but seems likely    Nelida Meuse III Pager [phone]  Mon-Fri 8a-5p [phone] after 5p, weekends, holidays

## 2017-01-06 DIAGNOSIS — I10 Essential (primary) hypertension: Secondary | ICD-10-CM

## 2017-01-06 DIAGNOSIS — R748 Abnormal levels of other serum enzymes: Secondary | ICD-10-CM

## 2017-01-06 DIAGNOSIS — J449 Chronic obstructive pulmonary disease, unspecified: Secondary | ICD-10-CM

## 2017-01-06 LAB — GLUCOSE, CAPILLARY: GLUCOSE-CAPILLARY: 79 mg/dL (ref 65–99)

## 2017-01-06 LAB — COMPREHENSIVE METABOLIC PANEL
ALBUMIN: 2.8 g/dL — AB (ref 3.5–5.0)
ALK PHOS: 135 U/L — AB (ref 38–126)
ALT: 109 U/L — ABNORMAL HIGH (ref 14–54)
AST: 51 U/L — ABNORMAL HIGH (ref 15–41)
Anion gap: 4 — ABNORMAL LOW (ref 5–15)
BUN: <5 mg/dL — ABNORMAL LOW (ref 6–20)
CALCIUM: 8 mg/dL — AB (ref 8.9–10.3)
CO2: 30 mmol/L (ref 22–32)
CREATININE: 0.77 mg/dL (ref 0.44–1.00)
Chloride: 108 mmol/L (ref 101–111)
GFR calc non Af Amer: >60 mL/min (ref >60)
GLUCOSE: 101 mg/dL — AB (ref 65–99)
Potassium: 3.2 mmol/L — ABNORMAL LOW (ref 3.5–5.1)
SODIUM: 142 mmol/L (ref 135–145)
TOTAL PROTEIN: 5 g/dL — AB (ref 6.5–8.1)
Total Bilirubin: 0.6 mg/dL (ref 0.3–1.2)

## 2017-01-06 LAB — CBC
HCT: 36.1 % (ref 36.0–46.0)
Hemoglobin: 11.7 g/dL — ABNORMAL LOW (ref 12.0–15.0)
MCH: 30.5 pg (ref 26.0–34.0)
MCHC: 32.4 g/dL (ref 30.0–36.0)
MCV: 94.3 fL (ref 78.0–100.0)
PLATELETS: 202 10*3/uL (ref 150–400)
RBC: 3.83 MIL/uL — ABNORMAL LOW (ref 3.87–5.11)
RDW: 13 % (ref 11.5–15.5)
WBC: 5.7 10*3/uL (ref 4.0–10.5)

## 2017-01-06 MED ORDER — FAMOTIDINE 20 MG PO TABS
20.0000 mg | ORAL_TABLET | Freq: Two times a day (BID) | ORAL | Status: DC
  Administered 2017-01-06 – 2017-01-07 (×2): 20 mg via ORAL
  Filled 2017-01-06 (×2): qty 1

## 2017-01-06 MED ORDER — MORPHINE SULFATE (PF) 2 MG/ML IV SOLN
2.0000 mg | INTRAVENOUS | Status: DC | PRN
  Administered 2017-01-06 – 2017-01-07 (×4): 2 mg via INTRAVENOUS
  Filled 2017-01-06 (×4): qty 1

## 2017-01-06 NOTE — Progress Notes (Signed)
Patient vomited and experiencing severe pain approx 1 hour after consuming full liquid diet. Paged PA Azucena Freed and Dr. Broadus John to notify. Awaiting callback/orders.

## 2017-01-06 NOTE — Plan of Care (Signed)
Problem: Safety: Goal: Ability to remain free from injury will improve Outcome: Progressing No safety issues noted  Problem: Pain Managment: Goal: General experience of comfort will improve Outcome: Progressing Medicated twice this shift with moderate relief  Problem: Skin Integrity: Goal: Risk for impaired skin integrity will decrease Outcome: Progressing No skin issues  Problem: Tissue Perfusion: Goal: Risk factors for ineffective tissue perfusion will decrease Outcome: Progressing No S/S of DVT noted  Problem: Activity: Goal: Risk for activity intolerance will decrease Outcome: Progressing Tolerates activity well  Problem: Nutrition: Goal: Adequate nutrition will be maintained Outcome: Progressing Tolerates clear liquid diet well  Problem: Bowel/Gastric: Goal: Will not experience complications related to bowel motility Outcome: Progressing Medicated with Zofran earlier this shift with full relief

## 2017-01-06 NOTE — Progress Notes (Signed)
Daily Rounding Note  01/06/2017, 9:26 AM  LOS: 2 days   SUBJECTIVE:   Chief complaint:  RUQ pain and nausea.  Nausea is gone, pain imprved with prn opiates   Hospitalist advanced diet to [place] from clears  OBJECTIVE:         Vital signs in last 24 hours:    Temp:  [97.5 F (36.4 C)-97.7 F (36.5 C)] 97.5 F (36.4 C) (02/26 0615) Pulse Rate:  [52-64] 52 (02/26 0615) Resp:  [16] 16 (02/26 0615) BP: (93-125)/(46-74) 94/46 (02/26 0615) SpO2:  [97 %-100 %] 97 % (02/26 0615) Weight:  [80 kg (176 lb 5.9 oz)] 80 kg (176 lb 5.9 oz) (02/26 0615) Last BM Date: 01/04/17 Filed Weights   01/06/17 0615  Weight: 80 kg (176 lb 5.9 oz)   General: looks well, comfortable   Heart: RRR Chest: some crackles in bases L >> R.  Smokers type husky voice/ Abdomen: soft, NT, ND.  BS active.   Extremities: no CCE Neuro/Psych:  Pleasant, calm, no gross deficits.  Fully alert and oriented.   Intake/Output from previous day: 02/25 0701 - 02/26 0700 In: 0  Out: 1000 [Urine:1000]  Intake/Output this shift: No intake/output data recorded.  Lab Results:  Recent Labs  01/05/17 0213 01/06/17 0213  WBC 5.9 5.7  HGB 12.9 11.7*  HCT 37.8 36.1  PLT 217 202   BMET  Recent Labs  01/05/17 0213 01/06/17 0213  NA 139 142  K 3.7 3.2*  CL 104 108  CO2 26 30  GLUCOSE 104* 101*  BUN 6 <5*  CREATININE 0.74 0.77  CALCIUM 8.3* 8.0*   LFT  Recent Labs  01/05/17 0213 01/06/17 0213  PROT 5.7* 5.0*  ALBUMIN 3.2* 2.8*  AST 113* 51*  ALT 172* 109*  ALKPHOS 168* 135*  BILITOT 1.6* 0.6   PT/INR No results for input(s): LABPROT, INR in the last 72 hours. Hepatitis Panel No results for input(s): HEPBSAG, HCVAB, HEPAIGM, HEPBIGM in the last 72 hours.  Studies/Results: Dg Chest 2 View  Result Date: 01/05/2017 CLINICAL DATA:  Pt having nausea/vomiting/abdominal pain. Concern for Choledocholithiases/mild gall stone pancreatitis?  Gallbladder removed in 2014. Pt having some SOB. Hx HTN EXAM: CHEST  2 VIEW COMPARISON:  07/18/2016 FINDINGS: The heart size and mediastinal contours are within normal limits. Both lungs are clear. No pleural effusion or pneumothorax. The visualized skeletal structures are unremarkable. IMPRESSION: No active cardiopulmonary disease. Electronically Signed   By: Lajean Manes M.D.   On: 01/05/2017 15:39   Mr 3d Recon At Scanner  Result Date: 01/05/2017 CLINICAL DATA:  Elevated liver enzymes and elevated lipase. EXAM: MRI ABDOMEN WITHOUT AND WITH CONTRAST (INCLUDING MRCP) TECHNIQUE: Multiplanar multisequence MR imaging of the abdomen was performed both before and after the administration of intravenous contrast. Heavily T2-weighted images of the biliary and pancreatic ducts were obtained, and three-dimensional MRCP images were rendered by post processing. CONTRAST:  66mL MULTIHANCE GADOBENATE DIMEGLUMINE 529 MG/ML IV SOLN COMPARISON:  CT 01/04/2017 FINDINGS: Lower chest: No acute findings. Hepatobiliary: No focal liver abnormality. No abnormal areas of enhancement noted. Previous cholecystectomy. Mild intrahepatic bile duct dilatation. There is fusiform dilatation of the common bile duct which measures up to 9 mm, image 19 of series 4. No choledocholithiasis or mass identified. Pancreas: No mass, inflammatory changes, or other parenchymal abnormality identified. The pancreatic duct has a normal caliber. Spleen:  Within normal limits in size and appearance. Adrenals/Urinary Tract: No masses identified. No  evidence of hydronephrosis. Stomach/Bowel: Visualized portions within the abdomen are unremarkable. Vascular/Lymphatic: No pathologically enlarged lymph nodes identified. No abdominal aortic aneurysm demonstrated. Other:  None. Musculoskeletal: No suspicious bone lesions identified. IMPRESSION: 1. No acute findings identified. 2. Mild intrahepatic biliary dilatation with increase caliber of the common bile duct. No  choledocholithiasis or mass noted. Electronically Signed   By: Kerby Moors M.D.   On: 01/05/2017 20:41   Mr Abdomen Mrcp Moise Boring Contast  Result Date: 01/05/2017 CLINICAL DATA:  Elevated liver enzymes and elevated lipase. EXAM: MRI ABDOMEN WITHOUT AND WITH CONTRAST (INCLUDING MRCP) TECHNIQUE: Multiplanar multisequence MR imaging of the abdomen was performed both before and after the administration of intravenous contrast. Heavily T2-weighted images of the biliary and pancreatic ducts were obtained, and three-dimensional MRCP images were rendered by post processing. CONTRAST:  8mL MULTIHANCE GADOBENATE DIMEGLUMINE 529 MG/ML IV SOLN COMPARISON:  CT 01/04/2017 FINDINGS: Lower chest: No acute findings. Hepatobiliary: No focal liver abnormality. No abnormal areas of enhancement noted. Previous cholecystectomy. Mild intrahepatic bile duct dilatation. There is fusiform dilatation of the common bile duct which measures up to 9 mm, image 19 of series 4. No choledocholithiasis or mass identified. Pancreas: No mass, inflammatory changes, or other parenchymal abnormality identified. The pancreatic duct has a normal caliber. Spleen:  Within normal limits in size and appearance. Adrenals/Urinary Tract: No masses identified. No evidence of hydronephrosis. Stomach/Bowel: Visualized portions within the abdomen are unremarkable. Vascular/Lymphatic: No pathologically enlarged lymph nodes identified. No abdominal aortic aneurysm demonstrated. Other:  None. Musculoskeletal: No suspicious bone lesions identified. IMPRESSION: 1. No acute findings identified. 2. Mild intrahepatic biliary dilatation with increase caliber of the common bile duct. No choledocholithiasis or mass noted. Electronically Signed   By: Kerby Moors M.D.   On: 01/05/2017 20:41   Scheduled Meds: . enoxaparin (LOVENOX) injection  40 mg Subcutaneous Q24H  . famotidine (PEPCID) IV  20 mg Intravenous Q12H  . FLUoxetine  20 mg Oral Daily  . gabapentin  300 mg  Oral TID  . nicotine  14 mg Transdermal Daily   Continuous Infusions: . sodium chloride 100 mL/hr at 01/06/17 0615   PRN Meds:.acetaminophen **OR** acetaminophen, albuterol, ALPRAZolam, methocarbamol, morphine injection, ondansetron **OR** ondansetron (ZOFRAN) IV, oxyCODONE-acetaminophen, promethazine  ASSESMENT:   *  Pancreatitis.  MRCP: Mild intrahepatic biliary dilatation with increase caliber of the common bile duct to 9 mm. No choledocholithiasis or mass noted.  No pancreatitis. Suspect biliary etiology, passed CBD stone? Vs meds.     *  S/p cholecystectomy for "sludge" ~ 2014.   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     PLAN   *  Agree with full liquid diet.  ? Pursue ERCP, will d/w MD Dr Joanie Coddington  01/06/2017, 9:26 AM Pager: [phone]

## 2017-01-06 NOTE — Progress Notes (Signed)
PROGRESS NOTE    Erin Prince  YCX:[id number] DOB: 1961-09-28 DOA: 01/04/2017 PCP: [name], MD  Brief Narrative: 55/F with hypertension, depression, anxiety, hyperlipidemia, GERD, and chronic lower abdominal pain and presented in transfer from [facility] where she was seen for nausea, vomiting, and abdominal pain.  Assessment & Plan:   1. Abd pain/N/V -suspect passed stone, mild gall stone pancreatitis -Labs reveal lipase 1042, alk phos 199, AST 160, ALT 223, and total bili 2.0  -CT abd/pelv without acute pathology; she is s/p cholecystectomy 3-4 years ago -MRCP-no choledocholithiasis, LFts improving -will advance diet, [facility] following, Cmet in am  2. GERD - No EGD report on file  - Managed with daily Protonix at home - Treat with IV Pepcid q12h for now    3. Hypertension  - BP at goal  - hold lisinopril  4. COPD  - Stable on admission, no exacerbation  - Continue prn albuterol    5. Depression, anxiety  - Appears to be stable - Continue current management with Prozac, prn Xanax    6. Hyperlipidemia  - Check triglycerides as above  - Hold Zocor given acutely elevated LFT's    7. Hypokalemia  - replace  DVT prophylaxis: sq Lovenox  Code Status: Full  Family Communication: Husband and son updated at bedside with patient's permission Disposition Plan: Admit to med-surg    Consultants:   [facility]   Procedures:    Subjective: Feels better, no abd pain  Objective: Vitals:   01/05/17 0853 01/05/17 1400 01/05/17 2202 01/06/17 0615  BP: (!) 108/59 125/74 (!) 93/48 (!) 94/46  Pulse: 63 64 63 (!) 52  Resp: _0 Temp: 98 F (36.7 C) 97.7 F (36.5 C) 97.6 F (36.4 C) 97.5 F (36.4 C)  TempSrc:   Oral Oral  SpO2: 99% 100% 100% 97%  Weight:    80 kg (176 lb 5.9 oz)    Intake/Output Summary (Last 24 hours) at 01/06/17 1255 Last data filed at 01/06/17 0900  Gross per 24 hour  Intake              240 ml  Output              1000 ml  Net             -760 ml   Filed Weights   01/06/17 0615  Weight: 80 kg (176 lb 5.9 oz)    Examination:  General exam: Appears calm and comfortable  Respiratory system: Clear to auscultation. Respiratory effort normal. Cardiovascular system: S1 & S2 heard, RRR. No JVD, murmurs, rubs, gallops or clicks. No pedal edema. Gastrointestinal system: Abdomen is nondistended, soft and nontender.  Normal bowel sounds heard. Central nervous system: Alert and oriented. No focal neurological deficits. Extremities: Symmetric 5 x 5 power. Skin: No rashes, lesions or ulcers Psychiatry: Judgement and insight appear normal. Mood & affect appropriate.     Data Reviewed: I have personally reviewed following labs and imaging studies  CBC:  Recent Labs Lab 01/05/17 0213 01/06/17 0213  WBC 5.9 5.7  NEUTROABS 3.6  --   HGB 12.9 11.7*  HCT 37.8 36.1  MCV 91.5 94.3  PLT 217 497   Basic Metabolic Panel:  Recent Labs Lab 01/05/17 0213 01/06/17 0213  NA 139 142  K 3.7 3.2*  CL 104 108  CO2 26 30  GLUCOSE 104* 101*  BUN 6 <5*  CREATININE 0.74 0.77  CALCIUM 8.3* 8.0*  MG 2.0  --  GFR: CrCl cannot be calculated (Unknown ideal weight.). Liver Function Tests:  Recent Labs Lab 01/05/17 0213 01/06/17 0213  AST 113* 51*  ALT 172* 109*  ALKPHOS 168* 135*  BILITOT 1.6* 0.6  PROT 5.7* 5.0*  ALBUMIN 3.2* 2.8*    Recent Labs Lab 01/05/17 0213  LIPASE 66*   No results for input(s): AMMONIA in the last 168 hours. Coagulation Profile: No results for input(s): INR, PROTIME in the last 168 hours. Cardiac Enzymes: No results for input(s): CKTOTAL, CKMB, CKMBINDEX, TROPONINI in the last 168 hours. BNP (last 3 results) No results for input(s): PROBNP in the last 8760 hours. HbA1C: No results for input(s): HGBA1C in the last 72 hours. CBG:  Recent Labs Lab 01/06/17 0610  GLUCAP 79   Lipid Profile:  Recent Labs  01/05/17 0213  TRIG 68   Thyroid Function  Tests: No results for input(s): TSH, T4TOTAL, FREET4, T3FREE, THYROIDAB in the last 72 hours. Anemia Panel: No results for input(s): VITAMINB12, FOLATE, FERRITIN, TIBC, IRON, RETICCTPCT in the last 72 hours. Urine analysis: No results found for: COLORURINE, APPEARANCEUR, LABSPEC, PHURINE, GLUCOSEU, HGBUR, BILIRUBINUR, KETONESUR, PROTEINUR, UROBILINOGEN, NITRITE, LEUKOCYTESUR Sepsis Labs: _0 (procalcitonin:4,lacticidven:4)  )No results found for this or any previous visit (from the past 240 hour(s)).       Radiology Studies: Dg Chest 2 View  Result Date: 01/05/2017 CLINICAL DATA:  Pt having nausea/vomiting/abdominal pain. Concern for Choledocholithiases/mild gall stone pancreatitis? Gallbladder removed in 2014. Pt having some SOB. Hx HTN EXAM: CHEST  2 VIEW COMPARISON:  07/18/2016 FINDINGS: The heart size and mediastinal contours are within normal limits. Both lungs are clear. No pleural effusion or pneumothorax. The visualized skeletal structures are unremarkable. IMPRESSION: No active cardiopulmonary disease. Electronically Signed   By: Lajean Manes M.D.   On: 01/05/2017 15:39   Mr 3d Recon At Scanner  Result Date: 01/05/2017 CLINICAL DATA:  Elevated liver enzymes and elevated lipase. EXAM: MRI ABDOMEN WITHOUT AND WITH CONTRAST (INCLUDING MRCP) TECHNIQUE: Multiplanar multisequence MR imaging of the abdomen was performed both before and after the administration of intravenous contrast. Heavily T2-weighted images of the biliary and pancreatic ducts were obtained, and three-dimensional MRCP images were rendered by post processing. CONTRAST:  8m MULTIHANCE GADOBENATE DIMEGLUMINE 529 MG/ML IV SOLN COMPARISON:  CT 01/04/2017 FINDINGS: Lower chest: No acute findings. Hepatobiliary: No focal liver abnormality. No abnormal areas of enhancement noted. Previous cholecystectomy. Mild intrahepatic bile duct dilatation. There is fusiform dilatation of the common bile duct which measures up to 9  mm, image 19 of series 4. No choledocholithiasis or mass identified. Pancreas: No mass, inflammatory changes, or other parenchymal abnormality identified. The pancreatic duct has a normal caliber. Spleen:  Within normal limits in size and appearance. Adrenals/Urinary Tract: No masses identified. No evidence of hydronephrosis. Stomach/Bowel: Visualized portions within the abdomen are unremarkable. Vascular/Lymphatic: No pathologically enlarged lymph nodes identified. No abdominal aortic aneurysm demonstrated. Other:  None. Musculoskeletal: No suspicious bone lesions identified. IMPRESSION: 1. No acute findings identified. 2. Mild intrahepatic biliary dilatation with increase caliber of the common bile duct. No choledocholithiasis or mass noted. Electronically Signed   By: TKerby MoorsM.D.   On: 01/05/2017 20:41   Mr Abdomen Mrcp WMoise BoringContast  Result Date: 01/05/2017 CLINICAL DATA:  Elevated liver enzymes and elevated lipase. EXAM: MRI ABDOMEN WITHOUT AND WITH CONTRAST (INCLUDING MRCP) TECHNIQUE: Multiplanar multisequence MR imaging of the abdomen was performed both before and after the administration of intravenous contrast. Heavily T2-weighted images of the biliary and pancreatic ducts were  obtained, and three-dimensional MRCP images were rendered by post processing. CONTRAST:  36m MULTIHANCE GADOBENATE DIMEGLUMINE 529 MG/ML IV SOLN COMPARISON:  CT 01/04/2017 FINDINGS: Lower chest: No acute findings. Hepatobiliary: No focal liver abnormality. No abnormal areas of enhancement noted. Previous cholecystectomy. Mild intrahepatic bile duct dilatation. There is fusiform dilatation of the common bile duct which measures up to 9 mm, image 19 of series 4. No choledocholithiasis or mass identified. Pancreas: No mass, inflammatory changes, or other parenchymal abnormality identified. The pancreatic duct has a normal caliber. Spleen:  Within normal limits in size and appearance. Adrenals/Urinary Tract: No masses  identified. No evidence of hydronephrosis. Stomach/Bowel: Visualized portions within the abdomen are unremarkable. Vascular/Lymphatic: No pathologically enlarged lymph nodes identified. No abdominal aortic aneurysm demonstrated. Other:  None. Musculoskeletal: No suspicious bone lesions identified. IMPRESSION: 1. No acute findings identified. 2. Mild intrahepatic biliary dilatation with increase caliber of the common bile duct. No choledocholithiasis or mass noted. Electronically Signed   By: TKerby MoorsM.D.   On: 01/05/2017 20:41        Scheduled Meds: . enoxaparin (LOVENOX) injection  40 mg Subcutaneous Q24H  . famotidine  20 mg Oral BID  . FLUoxetine  20 mg Oral Daily  . gabapentin  300 mg Oral TID  . nicotine  14 mg Transdermal Daily   Continuous Infusions: . sodium chloride 100 mL/hr at 01/06/17 0930     LOS: 2 days    Time spent: 389m    PrDomenic PoliteMD [facility] Pager [phone] 7PM-7AM, please contact night-coverage [email or web address] Password [email or web address]/26/2018, 12:55 PM

## 2017-01-07 ENCOUNTER — Encounter: Payer: Self-pay | Admitting: Gastroenterology

## 2017-01-07 DIAGNOSIS — K851 Biliary acute pancreatitis without necrosis or infection: Principal | ICD-10-CM

## 2017-01-07 DIAGNOSIS — R748 Abnormal levels of other serum enzymes: Secondary | ICD-10-CM

## 2017-01-07 DIAGNOSIS — K838 Other specified diseases of biliary tract: Secondary | ICD-10-CM

## 2017-01-07 LAB — CBC
HEMATOCRIT: 38.8 % (ref 36.0–46.0)
Hemoglobin: 12.6 g/dL (ref 12.0–15.0)
MCH: 30.8 pg (ref 26.0–34.0)
MCHC: 32.5 g/dL (ref 30.0–36.0)
MCV: 94.9 fL (ref 78.0–100.0)
Platelets: 233 10*3/uL (ref 150–400)
RBC: 4.09 MIL/uL (ref 3.87–5.11)
RDW: 12.9 % (ref 11.5–15.5)
WBC: 7.2 10*3/uL (ref 4.0–10.5)

## 2017-01-07 LAB — GLUCOSE, CAPILLARY: Glucose-Capillary: 82 mg/dL (ref 65–99)

## 2017-01-07 LAB — COMPREHENSIVE METABOLIC PANEL
ALBUMIN: 2.9 g/dL — AB (ref 3.5–5.0)
ALK PHOS: 155 U/L — AB (ref 38–126)
ALT: 108 U/L — ABNORMAL HIGH (ref 14–54)
AST: 61 U/L — AB (ref 15–41)
Anion gap: 6 (ref 5–15)
BILIRUBIN TOTAL: 0.6 mg/dL (ref 0.3–1.2)
CALCIUM: 8.2 mg/dL — AB (ref 8.9–10.3)
CO2: 31 mmol/L (ref 22–32)
Chloride: 106 mmol/L (ref 101–111)
Creatinine, Ser: 0.72 mg/dL (ref 0.44–1.00)
GFR calc Af Amer: >60 mL/min (ref >60)
GFR calc non Af Amer: >60 mL/min (ref >60)
GLUCOSE: 87 mg/dL (ref 65–99)
POTASSIUM: 3.5 mmol/L (ref 3.5–5.1)
Sodium: 143 mmol/L (ref 135–145)
TOTAL PROTEIN: 5.6 g/dL — AB (ref 6.5–8.1)

## 2017-01-07 LAB — LIPASE, BLOOD: LIPASE: 42 U/L (ref 11–51)

## 2017-01-07 NOTE — Progress Notes (Signed)
Daily Rounding Note  01/07/2017, 11:19 AM  LOS: 3 days   SUBJECTIVE:   Chief complaint: RUQ pain is mild.  Some n/v after FL diet last PM but tolerating clears this AM and looking forward to soft diet at lunch      OBJECTIVE:         Vital signs in last 24 hours:    Temp:  [97.6 F (36.4 C)-98.8 F (37.1 C)] 98.8 F (37.1 C) (02/27 0800) Pulse Rate:  [52-66] 65 (02/27 1050) Resp:  [16-20] 16 (02/27 0800) BP: (108-136)/(68-78) 108/74 (02/27 1050) SpO2:  [94 %-100 %] 94 % (02/27 1050) Weight:  [73.8 kg (162 lb 12.9 oz)-80.2 kg (176 lb 12.9 oz)] 73.8 kg (162 lb 12.9 oz) (02/27 0845) Last BM Date: 01/04/17 Filed Weights   01/06/17 0615 01/07/17 0700 01/07/17 0845  Weight: 80 kg (176 lb 5.9 oz) 80.2 kg (176 lb 12.9 oz) 73.8 kg (162 lb 12.9 oz)   General: looks well   Heart: RRR Chest: clear bil.  No dyspnea or cough Abdomen: soft, ND.  ActiveBS.  Slight RUQ tenderness  Extremities: no CCE Neuro/Psych:  Fully alert and oriented, engaged.  No deficits.   Intake/Output from previous day: 02/26 0701 - 02/27 0700 In: 3741.7 [P.O.:720; I.V.:3021.7] Out: -   Intake/Output this shift: No intake/output data recorded.  Lab Results:  Recent Labs  01/05/17 0213 01/06/17 0213 01/07/17 0558  WBC 5.9 5.7 7.2  HGB 12.9 11.7* 12.6  HCT 37.8 36.1 38.8  PLT 217 202 233   BMET  Recent Labs  01/05/17 0213 01/06/17 0213 01/07/17 0558  NA 139 142 143  K 3.7 3.2* 3.5  CL 104 108 106  CO2 26 30 31   GLUCOSE 104* 101* 87  BUN 6 <5* <5*  CREATININE 0.74 0.77 0.72  CALCIUM 8.3* 8.0* 8.2*   LFT  Recent Labs  01/05/17 0213 01/06/17 0213 01/07/17 0558  PROT 5.7* 5.0* 5.6*  ALBUMIN 3.2* 2.8* 2.9*  AST 113* 51* 61*  ALT 172* 109* 108*  ALKPHOS 168* 135* 155*  BILITOT 1.6* 0.6 0.6   PT/INR No results for input(s): LABPROT, INR in the last 72 hours. Hepatitis Panel No results for input(s): HEPBSAG, HCVAB,  HEPAIGM, HEPBIGM in the last 72 hours.  Studies/Results: Dg Chest 2 View  Result Date: 01/05/2017 CLINICAL DATA:  Pt having nausea/vomiting/abdominal pain. Concern for Choledocholithiases/mild gall stone pancreatitis? Gallbladder removed in 2014. Pt having some SOB. Hx HTN EXAM: CHEST  2 VIEW COMPARISON:  07/18/2016 FINDINGS: The heart size and mediastinal contours are within normal limits. Both lungs are clear. No pleural effusion or pneumothorax. The visualized skeletal structures are unremarkable. IMPRESSION: No active cardiopulmonary disease. Electronically Signed   By: Lajean Manes M.D.   On: 01/05/2017 15:39   Mr 3d Recon At Scanner  Result Date: 01/05/2017 CLINICAL DATA:  Elevated liver enzymes and elevated lipase. EXAM: MRI ABDOMEN WITHOUT AND WITH CONTRAST (INCLUDING MRCP) TECHNIQUE: Multiplanar multisequence MR imaging of the abdomen was performed both before and after the administration of intravenous contrast. Heavily T2-weighted images of the biliary and pancreatic ducts were obtained, and three-dimensional MRCP images were rendered by post processing. CONTRAST:  2mL MULTIHANCE GADOBENATE DIMEGLUMINE 529 MG/ML IV SOLN COMPARISON:  CT 01/04/2017 FINDINGS: Lower chest: No acute findings. Hepatobiliary: No focal liver abnormality. No abnormal areas of enhancement noted. Previous cholecystectomy. Mild intrahepatic bile duct dilatation. There is fusiform dilatation of the common bile duct which measures up  to 9 mm, image 19 of series 4. No choledocholithiasis or mass identified. Pancreas: No mass, inflammatory changes, or other parenchymal abnormality identified. The pancreatic duct has a normal caliber. Spleen:  Within normal limits in size and appearance. Adrenals/Urinary Tract: No masses identified. No evidence of hydronephrosis. Stomach/Bowel: Visualized portions within the abdomen are unremarkable. Vascular/Lymphatic: No pathologically enlarged lymph nodes identified. No abdominal aortic  aneurysm demonstrated. Other:  None. Musculoskeletal: No suspicious bone lesions identified. IMPRESSION: 1. No acute findings identified. 2. Mild intrahepatic biliary dilatation with increase caliber of the common bile duct. No choledocholithiasis or mass noted. Electronically Signed   By: Kerby Moors M.D.   On: 01/05/2017 20:41   Mr Abdomen Mrcp Moise Boring Contast  Result Date: 01/05/2017 CLINICAL DATA:  Elevated liver enzymes and elevated lipase. EXAM: MRI ABDOMEN WITHOUT AND WITH CONTRAST (INCLUDING MRCP) TECHNIQUE: Multiplanar multisequence MR imaging of the abdomen was performed both before and after the administration of intravenous contrast. Heavily T2-weighted images of the biliary and pancreatic ducts were obtained, and three-dimensional MRCP images were rendered by post processing. CONTRAST:  55mL MULTIHANCE GADOBENATE DIMEGLUMINE 529 MG/ML IV SOLN COMPARISON:  CT 01/04/2017 FINDINGS: Lower chest: No acute findings. Hepatobiliary: No focal liver abnormality. No abnormal areas of enhancement noted. Previous cholecystectomy. Mild intrahepatic bile duct dilatation. There is fusiform dilatation of the common bile duct which measures up to 9 mm, image 19 of series 4. No choledocholithiasis or mass identified. Pancreas: No mass, inflammatory changes, or other parenchymal abnormality identified. The pancreatic duct has a normal caliber. Spleen:  Within normal limits in size and appearance. Adrenals/Urinary Tract: No masses identified. No evidence of hydronephrosis. Stomach/Bowel: Visualized portions within the abdomen are unremarkable. Vascular/Lymphatic: No pathologically enlarged lymph nodes identified. No abdominal aortic aneurysm demonstrated. Other:  None. Musculoskeletal: No suspicious bone lesions identified. IMPRESSION: 1. No acute findings identified. 2. Mild intrahepatic biliary dilatation with increase caliber of the common bile duct. No choledocholithiasis or mass noted. Electronically Signed   By:  Kerby Moors M.D.   On: 01/05/2017 20:41    ASSESMENT:   *  Pancreatitis.  MRCP: Mild intrahepatic biliary dilatation with increase caliber of the common bile duct to 9 mm. No choledocholithiasis or mass noted.  No pancreatitis. Suspect biliary etiology, passed CBD stone? Vs meds.    sxs and labs improved.   *  S/p cholecystectomy for "sludge" ~ 2014.   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     PLAN   *  Agree with discharge home this PM if soft diet tolerated at lunch today.   *  Follow up with Dr Loletha Carrow 3/20 at 1330.  Consider arranging EUS and screening colonoscopy after that visit.       Azucena Freed  01/07/2017, 11:19 AM Pager: [phone]

## 2017-01-07 NOTE — Plan of Care (Signed)
Problem: Safety: Goal: Ability to remain free from injury will improve Outcome: Progressing No safety issues noted  Problem: Pain Managment: Goal: General experience of comfort will improve Outcome: Progressing Medicated once for pain this shift wit moderate relief  Problem: Bowel/Gastric: Goal: Will not experience complications related to bowel motility Outcome: Progressing No C/O N/V this shift

## 2017-01-07 NOTE — Discharge Summary (Signed)
Physician Discharge Summary  Erin Prince HKV:[id number] DOB: [birth year] DOA: 01/04/2017  PCP: [name], MD  Admit date: 01/04/2017 Discharge date: 01/07/2017  Time spent: 45 minutes  Recommendations for Outpatient Follow-up:  PCP in 1 week Dr.Danies 3/20, consider EUS/Screening colonsocopy  Discharge Diagnoses:  Principal Problem:   Pancreatitis, acute Active Problems:   Hypokalemia   Essential hypertension   Depression with anxiety   GERD (gastroesophageal reflux disease)   COPD (chronic obstructive pulmonary disease) (HCC)   Hyperlipidemia   Acute pancreatitis   Elevated liver enzymes   Dilation of biliary tract   Elevated lipase   Discharge Condition: stable  Diet recommendation: low fat diet  Filed Weights   01/06/17 0615 01/07/17 0700 01/07/17 0845  Weight: 80 kg (176 lb 5.9 oz) 80.2 kg (176 lb 12.9 oz) 73.8 kg (162 lb 12.9 oz)    History of present illness:  55/F with hypertension, depression, anxiety, hyperlipidemia, GERD, and chronic lower abdominal pain and presentedin transfer from [facility] where she was seen for nausea, vomiting, and abdominal pain  Hospital Course:  1. Abd pain/N/V -suspect passed stone, mild gall stone pancreatitis -Labs reveal lipase 1042 at [facility], alk phos 199, AST 160, ALT 223, and total bili 2.0  -CT abd/pelv without acute pathology; she is s/p cholecystectomy 3-4 years ago -MRCP-no choledocholithiasis, LFts improving, lipase normalized now -[facility] consulted, improved with supportive care, GI FU made For possible EUS and Screening colonoscopy as outpatient  2. GERD - No EGD report on file  - Managed with daily Protonix at home  3. Hypertension  - BP at goal  - resumed lisinopril  4. COPD  - Stable, Continue prn albuterol   5. Depression, anxiety  - stable, Continue with Prozac, prn Xanax   6. Hyperlipidemia  - Check triglycerides as above  - Zocor resumed since LFTs trending down  7.  Hypokalemia  - replaced  Consultations:  [facility] Dr.Pyrtle  Discharge Exam: Vitals:   01/07/17 1050 01/07/17 1415  BP: 108/74 100/62  Pulse: 65 75  Resp:  18  Temp:  98.2 F (36.8 C)    General: AAOx3 Cardiovascular: S1S2/RRR Respiratory: CTAB  Discharge Instructions   Discharge Instructions    Diet - low sodium heart healthy    Complete by:  As directed    Increase activity slowly    Complete by:  As directed      Current Discharge Medication List    CONTINUE these medications which have NOT CHANGED   Details  ALPRAZolam (XANAX) 1 MG tablet Take 1 mg by mouth 3 (three) times daily as needed for anxiety.     estradiol (ESTRACE) 0.5 MG tablet Take 0.5 mg by mouth daily.    FLUoxetine (PROZAC) 40 MG capsule Take 40 mg by mouth daily. Refills: 5    gabapentin (NEURONTIN) 300 MG capsule Take 300 mg by mouth 3 (three) times daily.    lisinopril (PRINIVIL,ZESTRIL) 20 MG tablet Take 20 mg by mouth daily. Refills: 5    methocarbamol (ROBAXIN) 500 MG tablet Take 1 tablet (500 mg total) by mouth every 6 (six) hours as needed for muscle spasms. Qty: 20 tablet, Refills: 0    ondansetron (ZOFRAN-ODT) 8 MG disintegrating tablet Place 8 mg inside cheek every 8 (eight) hours as needed for nausea/vomiting. Refills: 0    pantoprazole (PROTONIX) 40 MG tablet Take 40 mg by mouth daily. Refills: 5    simvastatin (ZOCOR) 20 MG tablet Take 20 mg by mouth every evening.  STOP taking these medications     oxyCODONE-acetaminophen (PERCOCET) 10-325 MG tablet      albuterol (PROVENTIL HFA;VENTOLIN HFA) 108 (90 Base) MCG/ACT inhaler      oxyCODONE-acetaminophen (PERCOCET) 7.5-325 MG tablet        Allergies  Allergen Reactions  . Bactrim [Sulfamethoxazole-Trimethoprim] Anaphylaxis    Anaphylaxis     Follow-up Information    Nelida Meuse III, MD Follow up on 01/28/2017.   Specialty:  Gastroenterology Why:  1:30 PM follow up with GI MD.   Contact information: [address] [place] [address] [phone]        [name], MD. Schedule an appointment as soon as possible for a visit in 1 week(s).   Specialty:  Family Medicine Contact information: [place] [address] [phone]            The results of significant diagnostics from this hospitalization (including imaging, microbiology, ancillary and laboratory) are listed below for reference.    Significant Diagnostic Studies: Dg Chest 2 View  Result Date: 01/05/2017 CLINICAL DATA:  Pt having nausea/vomiting/abdominal pain. Concern for Choledocholithiases/mild gall stone pancreatitis? Gallbladder removed in 2014. Pt having some SOB. Hx HTN EXAM: CHEST  2 VIEW COMPARISON:  07/18/2016 FINDINGS: The heart size and mediastinal contours are within normal limits. Both lungs are clear. No pleural effusion or pneumothorax. The visualized skeletal structures are unremarkable. IMPRESSION: No active cardiopulmonary disease. Electronically Signed   By: Lajean Manes M.D.   On: 01/05/2017 15:39   Mr 3d Recon At Scanner  Result Date: 01/05/2017 CLINICAL DATA:  Elevated liver enzymes and elevated lipase. EXAM: MRI ABDOMEN WITHOUT AND WITH CONTRAST (INCLUDING MRCP) TECHNIQUE: Multiplanar multisequence MR imaging of the abdomen was performed both before and after the administration of intravenous contrast. Heavily T2-weighted images of the biliary and pancreatic ducts were obtained, and three-dimensional MRCP images were rendered by post processing. CONTRAST:  59m MULTIHANCE GADOBENATE DIMEGLUMINE 529 MG/ML IV SOLN COMPARISON:  CT 01/04/2017 FINDINGS: Lower chest: No acute findings. Hepatobiliary: No focal liver abnormality. No abnormal areas of enhancement noted. Previous cholecystectomy. Mild intrahepatic bile duct dilatation. There is fusiform dilatation of the common bile duct which measures up to 9 mm, image 19 of series 4. No choledocholithiasis or mass identified.  Pancreas: No mass, inflammatory changes, or other parenchymal abnormality identified. The pancreatic duct has a normal caliber. Spleen:  Within normal limits in size and appearance. Adrenals/Urinary Tract: No masses identified. No evidence of hydronephrosis. Stomach/Bowel: Visualized portions within the abdomen are unremarkable. Vascular/Lymphatic: No pathologically enlarged lymph nodes identified. No abdominal aortic aneurysm demonstrated. Other:  None. Musculoskeletal: No suspicious bone lesions identified. IMPRESSION: 1. No acute findings identified. 2. Mild intrahepatic biliary dilatation with increase caliber of the common bile duct. No choledocholithiasis or mass noted. Electronically Signed   By: TKerby MoorsM.D.   On: 01/05/2017 20:41   Mr Abdomen Mrcp WMoise BoringContast  Result Date: 01/05/2017 CLINICAL DATA:  Elevated liver enzymes and elevated lipase. EXAM: MRI ABDOMEN WITHOUT AND WITH CONTRAST (INCLUDING MRCP) TECHNIQUE: Multiplanar multisequence MR imaging of the abdomen was performed both before and after the administration of intravenous contrast. Heavily T2-weighted images of the biliary and pancreatic ducts were obtained, and three-dimensional MRCP images were rendered by post processing. CONTRAST:  12mMULTIHANCE GADOBENATE DIMEGLUMINE 529 MG/ML IV SOLN COMPARISON:  CT 01/04/2017 FINDINGS: Lower chest: No acute findings. Hepatobiliary: No focal liver abnormality. No abnormal areas of enhancement noted. Previous cholecystectomy.  Mild intrahepatic bile duct dilatation. There is fusiform dilatation of the common bile duct which measures up to 9 mm, image 19 of series 4. No choledocholithiasis or mass identified. Pancreas: No mass, inflammatory changes, or other parenchymal abnormality identified. The pancreatic duct has a normal caliber. Spleen:  Within normal limits in size and appearance. Adrenals/Urinary Tract: No masses identified. No evidence of hydronephrosis. Stomach/Bowel: Visualized  portions within the abdomen are unremarkable. Vascular/Lymphatic: No pathologically enlarged lymph nodes identified. No abdominal aortic aneurysm demonstrated. Other:  None. Musculoskeletal: No suspicious bone lesions identified. IMPRESSION: 1. No acute findings identified. 2. Mild intrahepatic biliary dilatation with increase caliber of the common bile duct. No choledocholithiasis or mass noted. Electronically Signed   By: Kerby Moors M.D.   On: 01/05/2017 20:41    Microbiology: No results found for this or any previous visit (from the past 240 hour(s)).   Labs: Basic Metabolic Panel:  Recent Labs Lab 01/05/17 0213 01/06/17 0213 01/07/17 0558  NA 139 142 143  K 3.7 3.2* 3.5  CL 104 108 106  CO2 26 30 31   GLUCOSE 104* 101* 87  BUN 6 <5* <5*  CREATININE 0.74 0.77 0.72  CALCIUM 8.3* 8.0* 8.2*  MG 2.0  --   --    Liver Function Tests:  Recent Labs Lab 01/05/17 0213 01/06/17 0213 01/07/17 0558  AST 113* 51* 61*  ALT 172* 109* 108*  ALKPHOS 168* 135* 155*  BILITOT 1.6* 0.6 0.6  PROT 5.7* 5.0* 5.6*  ALBUMIN 3.2* 2.8* 2.9*    Recent Labs Lab 01/05/17 0213 01/07/17 0558  LIPASE 66* 42   No results for input(s): AMMONIA in the last 168 hours. CBC:  Recent Labs Lab 01/05/17 0213 01/06/17 0213 01/07/17 0558  WBC 5.9 5.7 7.2  NEUTROABS 3.6  --   --   HGB 12.9 11.7* 12.6  HCT 37.8 36.1 38.8  MCV 91.5 94.3 94.9  PLT 217 202 233   Cardiac Enzymes: No results for input(s): CKTOTAL, CKMB, CKMBINDEX, TROPONINI in the last 168 hours. BNP: BNP (last 3 results) No results for input(s): BNP in the last 8760 hours.  ProBNP (last 3 results) No results for input(s): PROBNP in the last 8760 hours.  CBG:  Recent Labs Lab 01/06/17 0610 01/07/17 0721  GLUCAP 79 82       SignedDomenic Polite MD.  [facility] 01/07/2017, 2:47 PM

## 2017-01-07 NOTE — Progress Notes (Signed)
Discharge instructions reviewed with patient. Patient stated understanding. IV removed. Patient is waiting for family for transportation

## 2017-01-07 NOTE — Plan of Care (Signed)
Problem: Safety: Goal: Ability to remain free from injury will improve Outcome: Progressing Safety precautions maintained, no fall or injury noted this shift.  Problem: Pain Managment: Goal: General experience of comfort will improve Outcome: Progressing Medicated for pain once this shift with moderate relief  Problem: Activity: Goal: Risk for activity intolerance will decrease Outcome: Progressing Tolerates activities well  Problem: Bowel/Gastric: Goal: Will not experience complications related to bowel motility Outcome: Progressing No C/O nausea or vomiting thishift

## 2017-01-28 ENCOUNTER — Ambulatory Visit (INDEPENDENT_AMBULATORY_CARE_PROVIDER_SITE_OTHER): Payer: Self-pay | Admitting: Gastroenterology

## 2017-01-28 ENCOUNTER — Encounter: Payer: Self-pay | Admitting: Gastroenterology

## 2017-01-28 VITALS — BP 86/66 | HR 68 | Ht 64.0 in | Wt 160.2 lb

## 2017-01-28 DIAGNOSIS — K219 Gastro-esophageal reflux disease without esophagitis: Secondary | ICD-10-CM

## 2017-01-28 DIAGNOSIS — K851 Biliary acute pancreatitis without necrosis or infection: Secondary | ICD-10-CM

## 2017-01-28 DIAGNOSIS — R945 Abnormal results of liver function studies: Principal | ICD-10-CM

## 2017-01-28 DIAGNOSIS — Z1211 Encounter for screening for malignant neoplasm of colon: Secondary | ICD-10-CM

## 2017-01-28 DIAGNOSIS — R7989 Other specified abnormal findings of blood chemistry: Secondary | ICD-10-CM

## 2017-01-28 NOTE — Progress Notes (Signed)
     [facility] Progress Note  Chief Complaint: Gallstone pancreatitis  Subjective  History:   This is a 56 year old woman that I recently saw in hospital consult as a week in transfer from [facility]. She presented with gallstone pancreatitis and elevated LFTs. Triglycerides were normal, and she denied alcohol use. She underwent MRCP that showed a CBD of 9 mm, and no choledocholithiasis, lipase normalized rapidly, and LFTs were improving at the time of discharge. I did her initial consultation, and she was followed by Dr. Hilarie Fredrickson for the remainder of her hospital stay.  She is complaining of many years of persistent heartburn with many offending foods, this occurs despite taking daily PPI. She has occasional nausea but no vomiting, her appetite is good and her weight stable. ROS: Cardiovascular:  no chest pain Respiratory: no dyspnea  The patient's Past Medical, Family and Social History were reviewed and are on file in the EMR.  Objective:  Med list reviewed  Vital signs in last 24 hrs: Vitals:   01/28/17 1308  BP: (!) 86/66  Pulse: 68    Physical Exam     HEENT: sclera anicteric, oral mucosa moist without lesions  Neck: supple, no thyromegaly, JVD or lymphadenopathy  Cardiac: RRR without murmurs, S1S2 heard, no peripheral edema  Pulm: clear to auscultation bilaterally, normal RR and effort noted  Abdomen: soft, No tenderness, with active bowel sounds. No guarding or palpable hepatosplenomegaly.  Skin; warm and dry, no jaundice or rash  Recent Labs:  CMP Latest Ref Rng & Units 01/07/2017 01/06/2017 01/05/2017  Glucose 65 - 99 mg/dL 87 101(H) 104(H)  BUN 6 - 20 mg/dL <5(L) <5(L) 6  Creatinine 0.44 - 1.00 mg/dL 0.72 0.77 0.74  Sodium 135 - 145 mmol/L 143 142 139  Potassium 3.5 - 5.1 mmol/L 3.5 3.2(L) 3.7  Chloride 101 - 111 mmol/L 106 108 104  CO2 22 - 32 mmol/L 31 30 26   Calcium 8.9 - 10.3 mg/dL 8.2(L) 8.0(L) 8.3(L)  Total Protein 6.5 - 8.1 g/dL  5.6(L) 5.0(L) 5.7(L)  Total Bilirubin 0.3 - 1.2 mg/dL 0.6 0.6 1.6(H)  Alkaline Phos 38 - 126 U/L 155(H) 135(H) 168(H)  AST 15 - 41 U/L 61(H) 51(H) 113(H)  ALT 14 - 54 U/L 108(H) 109(H) 172(H)   MRCP report as above  @ASSESSMENTPLANBEGIN @ Assessment: Encounter Diagnoses  Name Primary?  . Elevated LFTs Yes  . Gallstone pancreatitis   . Gastroesophageal reflux disease, esophagitis presence not specified   . Special screening for malignant neoplasms, colon      Plan:  She appears to have been fortunate with passage of a bile duct stone and improving LFTs. She is several years status post cholecystectomy  Clarene Critchley is in need of a screening colonoscopy At that time, I think an upper endoscopy is warranted to screen for Barrett's esophagus given her many years of persistent heartburn. I counseled her strongly on smoking cessation for long-term improvement in the symptoms.  Total time 20 minutes, over half spent in counseling and coordination of care.   Nelida Meuse III

## 2017-01-28 NOTE — Patient Instructions (Addendum)
You have been given a separate informational sheet regarding your tobacco use, the importance of quitting and local resources to help you quit.  Make every effort to stop smoking in order to help control your heartburn.  Call us when your Medicaid is approved and we will schedule your screening colonoscopy and and upper endoscopy.  It has been recommended to you by your physician that you have a(n) colon/EGD completed. Per your request, we did not schedule the procedure(s) today. Please contact our office at [phone] should you decide to have the procedure completed.  If you are age 4 or older, your body mass index should be between 23-30. Your Body mass index is 27.51 kg/m. If this is out of the aforementioned range listed, please consider follow up with your Primary Care Provider.  If you are age 51 or younger, your body mass index should be between 19-25. Your Body mass index is 27.51 kg/m. If this is out of the aformentioned range listed, please consider follow up with your Primary Care Provider.   Thank you for choosing [facility]  Dr Wilfrid Lund III

## 2017-02-21 ENCOUNTER — Encounter: Payer: Self-pay | Admitting: Gastroenterology

## 2017-03-18 DIAGNOSIS — I10 Essential (primary) hypertension: Secondary | ICD-10-CM | POA: Diagnosis not present

## 2017-03-18 DIAGNOSIS — F419 Anxiety disorder, unspecified: Secondary | ICD-10-CM | POA: Diagnosis not present

## 2017-03-18 DIAGNOSIS — Z720 Tobacco use: Secondary | ICD-10-CM

## 2017-03-18 DIAGNOSIS — R0789 Other chest pain: Secondary | ICD-10-CM | POA: Diagnosis not present

## 2017-03-18 DIAGNOSIS — R1310 Dysphagia, unspecified: Secondary | ICD-10-CM

## 2017-03-18 DIAGNOSIS — E785 Hyperlipidemia, unspecified: Secondary | ICD-10-CM | POA: Diagnosis not present

## 2017-03-18 DIAGNOSIS — F329 Major depressive disorder, single episode, unspecified: Secondary | ICD-10-CM | POA: Diagnosis not present

## 2017-03-19 DIAGNOSIS — R0789 Other chest pain: Secondary | ICD-10-CM | POA: Diagnosis not present

## 2017-03-19 DIAGNOSIS — Z720 Tobacco use: Secondary | ICD-10-CM | POA: Diagnosis not present

## 2017-03-19 DIAGNOSIS — R1310 Dysphagia, unspecified: Secondary | ICD-10-CM | POA: Diagnosis not present

## 2017-03-19 DIAGNOSIS — I10 Essential (primary) hypertension: Secondary | ICD-10-CM | POA: Diagnosis not present

## 2017-07-12 DIAGNOSIS — I10 Essential (primary) hypertension: Secondary | ICD-10-CM

## 2017-07-12 DIAGNOSIS — K56600 Partial intestinal obstruction, unspecified as to cause: Secondary | ICD-10-CM

## 2017-07-13 DIAGNOSIS — I10 Essential (primary) hypertension: Secondary | ICD-10-CM | POA: Diagnosis not present

## 2017-07-13 DIAGNOSIS — K56600 Partial intestinal obstruction, unspecified as to cause: Secondary | ICD-10-CM | POA: Diagnosis not present

## 2017-07-14 DIAGNOSIS — I10 Essential (primary) hypertension: Secondary | ICD-10-CM | POA: Diagnosis not present

## 2017-07-14 DIAGNOSIS — K56600 Partial intestinal obstruction, unspecified as to cause: Secondary | ICD-10-CM | POA: Diagnosis not present

## 2017-07-15 DIAGNOSIS — K56600 Partial intestinal obstruction, unspecified as to cause: Secondary | ICD-10-CM | POA: Diagnosis not present

## 2017-07-15 DIAGNOSIS — I10 Essential (primary) hypertension: Secondary | ICD-10-CM | POA: Diagnosis not present

## 2017-12-26 DIAGNOSIS — R748 Abnormal levels of other serum enzymes: Secondary | ICD-10-CM

## 2017-12-26 DIAGNOSIS — R739 Hyperglycemia, unspecified: Secondary | ICD-10-CM

## 2017-12-26 DIAGNOSIS — D72829 Elevated white blood cell count, unspecified: Secondary | ICD-10-CM | POA: Diagnosis not present

## 2017-12-26 DIAGNOSIS — E669 Obesity, unspecified: Secondary | ICD-10-CM | POA: Diagnosis not present

## 2017-12-26 DIAGNOSIS — R109 Unspecified abdominal pain: Secondary | ICD-10-CM | POA: Diagnosis not present

## 2017-12-26 DIAGNOSIS — R112 Nausea with vomiting, unspecified: Secondary | ICD-10-CM | POA: Diagnosis not present

## 2017-12-26 DIAGNOSIS — I10 Essential (primary) hypertension: Secondary | ICD-10-CM | POA: Diagnosis not present

## 2017-12-26 DIAGNOSIS — K859 Acute pancreatitis without necrosis or infection, unspecified: Secondary | ICD-10-CM | POA: Diagnosis not present

## 2017-12-27 DIAGNOSIS — R112 Nausea with vomiting, unspecified: Secondary | ICD-10-CM | POA: Diagnosis not present

## 2017-12-27 DIAGNOSIS — R748 Abnormal levels of other serum enzymes: Secondary | ICD-10-CM | POA: Diagnosis not present

## 2017-12-27 DIAGNOSIS — E669 Obesity, unspecified: Secondary | ICD-10-CM | POA: Diagnosis not present

## 2017-12-27 DIAGNOSIS — D72829 Elevated white blood cell count, unspecified: Secondary | ICD-10-CM | POA: Diagnosis not present

## 2017-12-27 DIAGNOSIS — I10 Essential (primary) hypertension: Secondary | ICD-10-CM | POA: Diagnosis not present

## 2017-12-27 DIAGNOSIS — R739 Hyperglycemia, unspecified: Secondary | ICD-10-CM | POA: Diagnosis not present

## 2017-12-27 DIAGNOSIS — K859 Acute pancreatitis without necrosis or infection, unspecified: Secondary | ICD-10-CM | POA: Diagnosis not present

## 2017-12-27 DIAGNOSIS — R109 Unspecified abdominal pain: Secondary | ICD-10-CM | POA: Diagnosis not present

## 2017-12-28 DIAGNOSIS — R109 Unspecified abdominal pain: Secondary | ICD-10-CM | POA: Diagnosis not present

## 2017-12-28 DIAGNOSIS — R748 Abnormal levels of other serum enzymes: Secondary | ICD-10-CM | POA: Diagnosis not present

## 2017-12-28 DIAGNOSIS — D72829 Elevated white blood cell count, unspecified: Secondary | ICD-10-CM | POA: Diagnosis not present

## 2017-12-28 DIAGNOSIS — R739 Hyperglycemia, unspecified: Secondary | ICD-10-CM | POA: Diagnosis not present

## 2017-12-29 DIAGNOSIS — R739 Hyperglycemia, unspecified: Secondary | ICD-10-CM | POA: Diagnosis not present

## 2017-12-29 DIAGNOSIS — D72829 Elevated white blood cell count, unspecified: Secondary | ICD-10-CM | POA: Diagnosis not present

## 2017-12-29 DIAGNOSIS — R109 Unspecified abdominal pain: Secondary | ICD-10-CM | POA: Diagnosis not present

## 2017-12-29 DIAGNOSIS — R748 Abnormal levels of other serum enzymes: Secondary | ICD-10-CM | POA: Diagnosis not present

## 2018-07-22 DIAGNOSIS — R0789 Other chest pain: Secondary | ICD-10-CM

## 2018-07-22 DIAGNOSIS — J449 Chronic obstructive pulmonary disease, unspecified: Secondary | ICD-10-CM

## 2018-07-22 DIAGNOSIS — R079 Chest pain, unspecified: Secondary | ICD-10-CM

## 2018-07-22 DIAGNOSIS — Z720 Tobacco use: Secondary | ICD-10-CM

## 2018-07-22 DIAGNOSIS — Z9119 Patient's noncompliance with other medical treatment and regimen: Secondary | ICD-10-CM

## 2018-07-22 DIAGNOSIS — F319 Bipolar disorder, unspecified: Secondary | ICD-10-CM

## 2018-07-22 DIAGNOSIS — I10 Essential (primary) hypertension: Secondary | ICD-10-CM

## 2018-07-23 DIAGNOSIS — R0602 Shortness of breath: Secondary | ICD-10-CM

## 2020-10-21 ENCOUNTER — Emergency Department (HOSPITAL_BASED_OUTPATIENT_CLINIC_OR_DEPARTMENT_OTHER): Payer: Medicare HMO

## 2020-10-21 ENCOUNTER — Inpatient Hospital Stay (HOSPITAL_BASED_OUTPATIENT_CLINIC_OR_DEPARTMENT_OTHER)
Admission: EM | Admit: 2020-10-21 | Discharge: 2020-10-23 | DRG: 390 | Disposition: A | Discharge status: Home / Self Care | Payer: Medicare HMO | Attending: Internal Medicine | Admitting: Internal Medicine

## 2020-10-21 ENCOUNTER — Other Ambulatory Visit: Payer: Self-pay

## 2020-10-21 ENCOUNTER — Encounter (HOSPITAL_BASED_OUTPATIENT_CLINIC_OR_DEPARTMENT_OTHER): Payer: Self-pay

## 2020-10-21 DIAGNOSIS — K219 Gastro-esophageal reflux disease without esophagitis: Secondary | ICD-10-CM | POA: Diagnosis present

## 2020-10-21 DIAGNOSIS — Z90710 Acquired absence of both cervix and uterus: Secondary | ICD-10-CM

## 2020-10-21 DIAGNOSIS — Z79890 Hormone replacement therapy (postmenopausal): Secondary | ICD-10-CM | POA: Diagnosis not present

## 2020-10-21 DIAGNOSIS — R110 Nausea: Secondary | ICD-10-CM | POA: Diagnosis not present

## 2020-10-21 DIAGNOSIS — F17200 Nicotine dependence, unspecified, uncomplicated: Secondary | ICD-10-CM | POA: Diagnosis present

## 2020-10-21 DIAGNOSIS — F419 Anxiety disorder, unspecified: Secondary | ICD-10-CM | POA: Diagnosis present

## 2020-10-21 DIAGNOSIS — Z79899 Other long term (current) drug therapy: Secondary | ICD-10-CM

## 2020-10-21 DIAGNOSIS — Z720 Tobacco use: Secondary | ICD-10-CM | POA: Diagnosis not present

## 2020-10-21 DIAGNOSIS — Z87892 Personal history of anaphylaxis: Secondary | ICD-10-CM

## 2020-10-21 DIAGNOSIS — K439 Ventral hernia without obstruction or gangrene: Secondary | ICD-10-CM | POA: Diagnosis present

## 2020-10-21 DIAGNOSIS — R748 Abnormal levels of other serum enzymes: Secondary | ICD-10-CM | POA: Diagnosis present

## 2020-10-21 DIAGNOSIS — F418 Other specified anxiety disorders: Secondary | ICD-10-CM | POA: Diagnosis present

## 2020-10-21 DIAGNOSIS — Z20822 Contact with and (suspected) exposure to covid-19: Secondary | ICD-10-CM | POA: Diagnosis present

## 2020-10-21 DIAGNOSIS — H01003 Unspecified blepharitis right eye, unspecified eyelid: Secondary | ICD-10-CM | POA: Diagnosis present

## 2020-10-21 DIAGNOSIS — J449 Chronic obstructive pulmonary disease, unspecified: Secondary | ICD-10-CM | POA: Diagnosis present

## 2020-10-21 DIAGNOSIS — F32A Depression, unspecified: Secondary | ICD-10-CM | POA: Diagnosis present

## 2020-10-21 DIAGNOSIS — K56600 Partial intestinal obstruction, unspecified as to cause: Principal | ICD-10-CM | POA: Diagnosis present

## 2020-10-21 DIAGNOSIS — R1030 Lower abdominal pain, unspecified: Secondary | ICD-10-CM | POA: Diagnosis not present

## 2020-10-21 DIAGNOSIS — I700 Atherosclerosis of aorta: Secondary | ICD-10-CM | POA: Diagnosis present

## 2020-10-21 DIAGNOSIS — E785 Hyperlipidemia, unspecified: Secondary | ICD-10-CM | POA: Diagnosis present

## 2020-10-21 DIAGNOSIS — I10 Essential (primary) hypertension: Secondary | ICD-10-CM | POA: Diagnosis present

## 2020-10-21 DIAGNOSIS — H01004 Unspecified blepharitis left upper eyelid: Secondary | ICD-10-CM | POA: Diagnosis not present

## 2020-10-21 DIAGNOSIS — K5903 Drug induced constipation: Secondary | ICD-10-CM | POA: Diagnosis not present

## 2020-10-21 DIAGNOSIS — K5900 Constipation, unspecified: Secondary | ICD-10-CM | POA: Diagnosis present

## 2020-10-21 DIAGNOSIS — Z882 Allergy status to sulfonamides status: Secondary | ICD-10-CM | POA: Diagnosis not present

## 2020-10-21 DIAGNOSIS — R109 Unspecified abdominal pain: Secondary | ICD-10-CM | POA: Diagnosis present

## 2020-10-21 HISTORY — DX: Tobacco use: Z72.0

## 2020-10-21 LAB — COMPREHENSIVE METABOLIC PANEL
ALT: 64 U/L — ABNORMAL HIGH (ref 0–44)
AST: 62 U/L — ABNORMAL HIGH (ref 15–41)
Albumin: 4.2 g/dL (ref 3.5–5.0)
Alkaline Phosphatase: 85 U/L (ref 38–126)
Anion gap: 9 (ref 5–15)
BUN: 14 mg/dL (ref 6–20)
CO2: 29 mmol/L (ref 22–32)
Calcium: 9 mg/dL (ref 8.9–10.3)
Chloride: 97 mmol/L — ABNORMAL LOW (ref 98–111)
Creatinine, Ser: 1.08 mg/dL — ABNORMAL HIGH (ref 0.44–1.00)
GFR, Estimated: 60 mL/min — ABNORMAL LOW (ref >60)
Glucose, Bld: 108 mg/dL — ABNORMAL HIGH (ref 70–99)
Potassium: 4.3 mmol/L (ref 3.5–5.1)
Sodium: 135 mmol/L (ref 135–145)
Total Bilirubin: 0.4 mg/dL (ref 0.3–1.2)
Total Protein: 7.7 g/dL (ref 6.5–8.1)

## 2020-10-21 LAB — CBC
HCT: 40 % (ref 36.0–46.0)
Hemoglobin: 13.1 g/dL (ref 12.0–15.0)
MCH: 31.3 pg (ref 26.0–34.0)
MCHC: 32.8 g/dL (ref 30.0–36.0)
MCV: 95.7 fL (ref 80.0–100.0)
Platelets: 290 10*3/uL (ref 150–400)
RBC: 4.18 MIL/uL (ref 3.87–5.11)
RDW: 11.9 % (ref 11.5–15.5)
WBC: 8.9 10*3/uL (ref 4.0–10.5)
nRBC: 0 % (ref 0.0–0.2)

## 2020-10-21 LAB — RESP PANEL BY RT-PCR (FLU A&B, COVID) ARPGX2
Influenza A by PCR: NEGATIVE
Influenza B by PCR: NEGATIVE
SARS Coronavirus 2 by RT PCR: NEGATIVE

## 2020-10-21 LAB — URINALYSIS, ROUTINE W REFLEX MICROSCOPIC
Bilirubin Urine: NEGATIVE
Glucose, UA: NEGATIVE mg/dL
Hgb urine dipstick: NEGATIVE
Ketones, ur: NEGATIVE mg/dL
Leukocytes,Ua: NEGATIVE
Nitrite: NEGATIVE
Protein, ur: NEGATIVE mg/dL
Specific Gravity, Urine: 1.025 (ref 1.005–1.030)
pH: 6 (ref 5.0–8.0)

## 2020-10-21 LAB — LIPASE, BLOOD: Lipase: 19 U/L (ref 11–51)

## 2020-10-21 MED ORDER — ACETAMINOPHEN 650 MG RE SUPP: 650.0000 mg | Freq: Four times a day (QID) | RECTAL | Status: DC | PRN

## 2020-10-21 MED ORDER — HYDROMORPHONE HCL 1 MG/ML IJ SOLN
0.7500 mg | INTRAMUSCULAR | Status: DC | PRN
  Administered 2020-10-21 – 2020-10-23 (×7): 0.75 mg via INTRAVENOUS
  Filled 2020-10-21 (×7): qty 1

## 2020-10-21 MED ORDER — ACETAMINOPHEN 325 MG PO TABS: 650.0000 mg | ORAL_TABLET | Freq: Four times a day (QID) | ORAL | Status: DC | PRN

## 2020-10-21 MED ORDER — HYDROMORPHONE HCL 1 MG/ML IJ SOLN
1.0000 mg | Freq: Once | INTRAMUSCULAR | Status: AC
  Administered 2020-10-21: 16:00:00 1 mg via INTRAVENOUS
  Filled 2020-10-21: qty 1

## 2020-10-21 MED ORDER — IOHEXOL 300 MG/ML  SOLN
100.0000 mL | Freq: Once | INTRAMUSCULAR | Status: AC
  Administered 2020-10-21: 16:00:00 100 mL via INTRAVENOUS

## 2020-10-21 MED ORDER — NICOTINE 21 MG/24HR TD PT24
21.0000 mg | MEDICATED_PATCH | Freq: Every day | TRANSDERMAL | Status: DC
  Administered 2020-10-22 – 2020-10-23 (×2): 21 mg via TRANSDERMAL
  Filled 2020-10-21 (×2): qty 1

## 2020-10-21 MED ORDER — SODIUM CHLORIDE 0.9 % IV BOLUS
1000.0000 mL | Freq: Once | INTRAVENOUS | Status: AC
  Administered 2020-10-21: 23:00:00 1000 mL via INTRAVENOUS

## 2020-10-21 MED ORDER — SODIUM CHLORIDE 0.9 % IV SOLN: INTRAVENOUS | Status: DC

## 2020-10-21 MED ORDER — ONDANSETRON HCL 4 MG/2ML IJ SOLN
4.0000 mg | Freq: Once | INTRAMUSCULAR | Status: AC
  Administered 2020-10-21: 16:00:00 4 mg via INTRAVENOUS
  Filled 2020-10-21: qty 2

## 2020-10-21 MED ORDER — ENOXAPARIN SODIUM 40 MG/0.4ML ~~LOC~~ SOLN
40.0000 mg | SUBCUTANEOUS | Status: DC
  Administered 2020-10-21 – 2020-10-22 (×2): 40 mg via SUBCUTANEOUS
  Filled 2020-10-21 (×2): qty 0.4

## 2020-10-21 MED ORDER — ONDANSETRON HCL 4 MG/2ML IJ SOLN
4.0000 mg | Freq: Four times a day (QID) | INTRAMUSCULAR | Status: DC | PRN
  Administered 2020-10-21 – 2020-10-22 (×2): 4 mg via INTRAVENOUS
  Filled 2020-10-21 (×3): qty 2

## 2020-10-21 MED ORDER — PANTOPRAZOLE SODIUM 40 MG IV SOLR
40.0000 mg | INTRAVENOUS | Status: DC
  Administered 2020-10-21 – 2020-10-22 (×2): 40 mg via INTRAVENOUS
  Filled 2020-10-21 (×2): qty 40

## 2020-10-21 MED ORDER — ACETAMINOPHEN 500 MG PO TABS
1000.0000 mg | ORAL_TABLET | Freq: Once | ORAL | Status: AC
  Administered 2020-10-21: 18:00:00 1000 mg via ORAL
  Filled 2020-10-21: qty 2

## 2020-10-21 MED ORDER — ONDANSETRON HCL 4 MG PO TABS: 4.0000 mg | ORAL_TABLET | Freq: Four times a day (QID) | ORAL | Status: DC | PRN

## 2020-10-21 MED ORDER — SODIUM CHLORIDE 0.9 % IV BOLUS
500.0000 mL | Freq: Once | INTRAVENOUS | Status: AC
  Administered 2020-10-21: 17:00:00 500 mL via INTRAVENOUS

## 2020-10-21 NOTE — ED Notes (Signed)
PT INSTRUCTED TO REMAIN NPO UNTIL FURTHER ORDERS

## 2020-10-21 NOTE — ED Notes (Signed)
Has had a complete hysterectomy and has had hernia surgery with mesh. Has intermittent abd pain, sharp pain. This episode onset was wed. Has had some vomiting as well.

## 2020-10-21 NOTE — H&P (Signed)
History and Physical    Erin Prince:[id number] DOB: 1961-08-29 DOA: 10/21/2020  PCP: [name], MD   Patient coming from: Home.  I have personally briefly reviewed patient's old medical records in [place]  Chief Complaint: Abdominal pain and distention.  HPI: Erin Prince is a 59 y.o. female with medical history significant of COPD, depression with anxiety, GERD, tobacco abuse, hyperlipidemia, hysterectomy and cholecystectomy who is coming from [place] due to progressively worse abdominal distention associated with decreased appetite, constipation, nausea, but no emesis for the past 7 days.  No melena, hematochezia.  She denies fever, chills, sore throat rhinorrhea.  No chest pain, palpitations, diaphoresis, PND, orthopnea or pitting edema of the lower extremities.  She denies dysuria, frequency or hematuria.  No polyuria, polydipsia, polyphagia or blurred vision.  ED Course: Initial vital signs were temperature 98 F, Pulse 114, respiration 18, BP 119/80 mmHg and O2 sat 95% on room air.    Lab work: Urinalysis was cloudy, but otherwise unremarkable.  CBC showed a white count of 8.9, hemoglobin 13.1 g/dL platelets 292.  Lipase was normal.  CMP showed a chloride of 97 mmol/L.  All other electrolytes are within normal range.  Glucose 180, BUN 14, and creatinine 1.0 mg/dL.  GFR was 60 mL/min.  AST 62 ALT 64 the rest of the hepatic functions are within expected range.  Imaging: There is fluid-filled prominent loops of small bowel in the lower abdomen with a soft transition point in the left of midline, raising the possibility of rarely a small bowel obstruction.  This is likely due to ideations.  There is large body and with stool throughout the colon with fecalization of distal small bowel contents, suggesting constipation.  There is aortic atherosclerosis.  Please see images and full regular report for further detail.  Review of Systems: As per HPI  otherwise all other systems reviewed and are negative.  Past Medical History:  Diagnosis Date  . Cancer ([place])   . Hypertension    Past Surgical History:  Procedure Laterality Date  . ABDOMINAL HYSTERECTOMY    . CHOLECYSTECTOMY     Social History  reports that she has been smoking. She has never used smokeless tobacco. She reports that she does not drink alcohol and does not use drugs.  Allergies  Allergen Reactions  . Bactrim [Sulfamethoxazole-Trimethoprim] Anaphylaxis    Anaphylaxis     Family History  Problem Relation Age of Onset  . Diabetes Mellitus II Mother   . Lung cancer Father   . Kidney disease Sister   . Pancreatic disease Neg Hx    Prior to Admission medications   Medication Sig Start Date End Date Taking? Authorizing Provider  amitriptyline (ELAVIL) 50 MG tablet Take 50 mg by mouth at bedtime.   Yes [provider]  estradiol (ESTRACE) 0.5 MG tablet Take 0.5 mg by mouth daily.   Yes [provider]  gabapentin (NEURONTIN) 300 MG capsule Take 300 mg by mouth 3 (three) times daily.   Yes [provider]  oxyCODONE-acetaminophen (PERCOCET) 10-325 MG tablet Take 1 tablet by mouth every 8 (eight) hours as needed. for pain 01/02/17  Yes [provider]  ALPRAZolam Duanne Moron) 1 MG tablet Take 1 mg by mouth 3 (three) times daily as needed for anxiety.     [provider]  ciprofloxacin (CIPRO) 500 MG tablet Take 500 mg by mouth 2 (two) times daily. 10/12/20   [provider]  cyclobenzaprine (FLEXERIL)  10 MG tablet Take 10 mg by mouth at bedtime as needed. 10/13/20   [provider]  FLUoxetine (PROZAC) 40 MG capsule Take 40 mg by mouth daily. 12/05/16   [provider]  lisinopril (PRINIVIL,ZESTRIL) 20 MG tablet Take 20 mg by mouth daily. 11/29/16   [provider]  ondansetron (ZOFRAN-ODT) 4 MG disintegrating tablet Take 4 mg by mouth every 6 (six) hours as needed for nausea/vomiting. 10/12/20    [provider]  pantoprazole (PROTONIX) 40 MG tablet Take 40 mg by mouth daily. 11/29/16   [provider]  simvastatin (ZOCOR) 20 MG tablet Take 20 mg by mouth every evening.    [provider]   Physical Exam: Vitals:   10/21/20 1800 10/21/20 1830 10/21/20 1842 10/21/20 2018  BP:  109/83  (!) 106/55  Pulse:  (!) 113  (!) 109  Resp: 18 18  20   Temp:   98.7 F (37.1 C) 98.2 F (36.8 C)  TempSrc:   Oral Oral  SpO2:  93%  99%  Weight:      Height:       Constitutional: NAD, calm, comfortable Eyes: PERRL, lids and conjunctivae normal ENMT: Mucous membranes are dry.  Posterior pharynx clear of any exudate or lesions. Neck: normal, supple, no masses, no thyromegaly Respiratory: clear to auscultation bilaterally, no wheezing, no crackles. Normal respiratory effort. No accessory muscle use.  Cardiovascular: Regular rate and rhythm, no murmurs / rubs / gallops. No extremity edema. 2+ pedal pulses. No carotid bruits.  Abdomen: Distended.  Bowel sounds positive.  Soft, mild diffuse tenderness, no guarding or rebound, no masses palpated. No hepatosplenomegaly.  Musculoskeletal: no clubbing / cyanosis.  Good ROM, no contractures. Normal muscle tone.  Skin: no rashes, lesions, ulcers on limited dermatological examination. Neurologic: CN 2-12 grossly intact. Sensation intact, DTR normal. Strength 5/5 in all 4.  Psychiatric: Normal judgment and insight. Alert and oriented x 3. Normal mood.   Labs on Admission: I have personally reviewed following labs and imaging studies  CBC: Recent Labs  Lab 10/21/20 1355  WBC 8.9  HGB 13.1  HCT 40.0  MCV 95.7  PLT 976    Basic Metabolic Panel: Recent Labs  Lab 10/21/20 1355  NA 135  K 4.3  CL 97*  CO2 29  GLUCOSE 108*  BUN 14  CREATININE 1.08*  CALCIUM 9.0    GFR: Estimated Creatinine Clearance: 57.8 mL/min (A) (by C-G formula based on SCr of 1.08 mg/dL (H)).  Liver Function Tests: Recent Labs  Lab  10/21/20 1355  AST 62*  ALT 64*  ALKPHOS 85  BILITOT 0.4  PROT 7.7  ALBUMIN 4.2    Urine analysis:    Component Value Date/Time   COLORURINE YELLOW 10/21/2020 1355   APPEARANCEUR CLOUDY (A) 10/21/2020 1355   LABSPEC 1.025 10/21/2020 1355   PHURINE 6.0 10/21/2020 1355   GLUCOSEU NEGATIVE 10/21/2020 1355   HGBUR NEGATIVE 10/21/2020 [place] 10/21/2020 [place] 10/21/2020 1355   PROTEINUR NEGATIVE 10/21/2020 1355   NITRITE NEGATIVE 10/21/2020 1355   LEUKOCYTESUR NEGATIVE 10/21/2020 1355    Radiological Exams on Admission: CT Abdomen Pelvis W Contrast  Result Date: 10/21/2020 CLINICAL DATA:  Abdominal pain and bloating for 4 days. Constipation. Bowel obstruction suspected. Patient reports bowel surgery 2 years ago. EXAM: CT ABDOMEN AND PELVIS WITH CONTRAST TECHNIQUE: Multidetector CT imaging of the abdomen and pelvis was performed using the standard protocol following bolus administration of intravenous contrast. CONTRAST:  123mL  OMNIPAQUE IOHEXOL 300 MG/ML  SOLN COMPARISON:  CT 10 days ago at [place]: Lower chest: Lung bases are clear. Minor dependent atelectasis. No pleural fluid. Hepatobiliary: Homogeneous liver attenuation without focal hepatic abnormality. Post cholecystectomy with clips in the gallbladder fossa. Mild intrahepatic biliary ductal prominence is stable from prior exam. Common bile duct is unchanged. No visualized choledocholithiasis. Pancreas: No ductal dilatation or inflammation. Spleen: Normal in size without focal abnormality. Adrenals/Urinary Tract: Normal adrenal glands. No hydronephrosis or perinephric edema. Homogeneous renal enhancement with symmetric excretion on delayed phase imaging. Urinary bladder is partially distended without wall thickening. Stomach/Bowel: Stomach physiologically distended. There is no gastric wall thickening. Normal positioning of the duodenum and ligament of Treitz. Proximal small  bowel are decompressed. Distal small bowel are prominent slightly fluid-filled measuring up to 2.7 cm. Softer incision in the anterior abdomen just to the left of midline, series 12, image 47. no mesenteric edema or stranding. Enteric sutures noted in the right lower quadrant small bowel. No terminal ileal inflammation or wall thickening. Mild fecalization of distal small bowel contents. Large volume of stool throughout the colon. No colonic wall thickening or pericolonic inflammation. Transverse colon is tortuous. Vascular/Lymphatic: Mild aortic atherosclerosis. No aortic aneurysm. No abdominopelvic adenopathy. Reproductive: Status post hysterectomy. No adnexal masses. Other: Postsurgical change of the anterior abdominal wall. Stable small fat containing anterior abdominal wall hernia above the site of surgical repair. No free air or ascites. Musculoskeletal: Lumbar degeneration with degenerative disc disease at L5-S1 and lower lumbar facet hypertrophy. There are no acute or suspicious osseous abnormalities. IMPRESSION: 1. Fluid-filled prominent loops of small bowel in the lower abdomen with soft transition point to the left of midline, raising possibility of early small bowel obstruction. This is likely due to adhesions. 2. Large volume of stool throughout the colon with fecalization of distal small bowel contents, suggesting constipation 3. Postsurgical change of the anterior abdominal wall. Stable small fat containing anterior abdominal wall hernia above the site of surgical repair. Aortic Atherosclerosis (ICD10-I70.0). Electronically Signed   By: Keith Rake M.D.   On: 10/21/2020 16:37    EKG: Independently reviewed.   Assessment/Plan Principal Problem:   Partial small bowel obstruction (HCC) Admit to telemetry/inpatient. Keep n.p.o. Continue IV fluids. Antiemetics as needed. Analgesics as needed. Consult general surgery if worsening or no improvement.  Active Problems:   Elevated liver  enzymes Lower than previous baseline. Continue IV hydration. Follow-up transaminases.    Aortic atherosclerosis (HCC)  Hyperlipidemia Smoking cessation suggested. Defer statin until tolerating oral intake.    Essential hypertension Restart lisinopril once tolerating oral intake. Monitor blood pressure, renal function electrolytes. As needed parenteral antihypertensive.    Depression with anxiety Restart amitriptyline once tolerating oral intake. Will be mindful of his antimuscarinic affect.    GERD (gastroesophageal reflux disease) Protonix 40 mg IVP daily.    COPD (chronic obstructive pulmonary disease) ([place]) Supplemental oxygen as needed. Bronchodilators as needed.     Tobacco use Nicotine replacement therapy ordered. Staff to provide tobacco cessation information.     DVT prophylaxis: Lovenox SQ. Code Status:   Full code. Family Communication:   Disposition Plan:   Patient is from:  Home.  Anticipated DC to:  Home.  Anticipated DC date:  10/23/2020.  Anticipated DC barriers: Clinical status.  Consults called: Admission status:  Inpatient/telemetry.   Severity of Illness: Medium to high due to SBO with abdominal distention and pain, but no emesis, dehydration or electrolyte imbalance.   Reubin Milan MD [facility]  How to contact the [facility] Attending or Consulting provider [place] or covering provider during after hours [place], for this patient?   1. Check the care team in [facility] and look for a) attending/consulting [facility] provider listed and b) the [facility] team listed 2. Log into [email or web address] and use [place]'s universal password to access. If you do not have the password, please contact the hospital operator. 3. Locate the [facility] provider you are looking for under [facility] and page to a number that you can be directly reached. 4. If you still have difficulty reaching the provider, please page the [facility] (Director on Call) for the Hospitalists listed  on amion for assistance.  10/21/2020, 10:22 PM   This document was prepared using Dragon voice recognition software and may contain some unintended transcription errors.

## 2020-10-21 NOTE — ED Provider Notes (Signed)
[facility] EMERGENCY DEPARTMENT Provider Note   CSN: [id number] Arrival date & time: 10/21/20  1331     History Chief Complaint  Patient presents with  . Abdominal Pain         Erin Prince is a 59 y.o. female.  Patient with a complaint of some intermittent intermittent abdominal pain ongoing for several weeks.  Worse in the last few days.  Week ago was seen at [facility] had CT scan of the abdomen done without any acute findings.  Except that they told her it has showed evidence of colitis. Patient's had abdominal surgery before and does have a repaired ventral hernia with mesh.  Patient states that she is having nausea but no vomiting.  Patient has had all 3 Covid vaccines        Past Medical History:  Diagnosis Date  . Cancer ([place])   . Hypertension     Patient Active Problem List   Diagnosis Date Noted  . Dilation of biliary tract   . Elevated lipase   . Elevated liver enzymes   . Pancreatitis, acute 01/05/2017  . Hypokalemia 01/05/2017  . Essential hypertension 01/05/2017  . Depression with anxiety 01/05/2017  . GERD (gastroesophageal reflux disease) 01/05/2017  . COPD (chronic obstructive pulmonary disease) ([place]) 01/05/2017  . Hyperlipidemia 01/05/2017  . Acute pancreatitis 01/05/2017    Past Surgical History:  Procedure Laterality Date  . ABDOMINAL HYSTERECTOMY    . CHOLECYSTECTOMY       OB History   No obstetric history on file.     Family History  Problem Relation Age of Onset  . Pancreatic disease Neg Hx     Social History   Tobacco Use  . Smoking status: Current Every Day Smoker  . Smokeless tobacco: Never Used  Substance Use Topics  . Alcohol use: No  . Drug use: No    Home Medications Prior to Admission medications   Medication Sig Start Date End Date Taking? Authorizing Provider  ALPRAZolam Duanne Moron) 1 MG tablet Take 1 mg by mouth 3 (three) times daily as needed for anxiety.     [provider]   estradiol (ESTRACE) 0.5 MG tablet Take 0.5 mg by mouth daily.    [provider]  FLUoxetine (PROZAC) 40 MG capsule Take 40 mg by mouth daily. 12/05/16   [provider]  gabapentin (NEURONTIN) 300 MG capsule Take 300 mg by mouth 3 (three) times daily.    [provider]  lisinopril (PRINIVIL,ZESTRIL) 20 MG tablet Take 20 mg by mouth daily. 11/29/16   [provider]  methocarbamol (ROBAXIN) 500 MG tablet Take 1 tablet (500 mg total) by mouth every 6 (six) hours as needed for muscle spasms. 02/22/16   Zenovia Jarred, DO  ondansetron (ZOFRAN-ODT) 8 MG disintegrating tablet Place 8 mg inside cheek every 8 (eight) hours as needed for nausea/vomiting. 01/03/17   [provider]  oxyCODONE-acetaminophen (PERCOCET) 10-325 MG tablet Take 1 tablet by mouth every 8 (eight) hours as needed. for pain 01/02/17   [provider]  pantoprazole (PROTONIX) 40 MG tablet Take 40 mg by mouth daily. 11/29/16   [provider]  simvastatin (ZOCOR) 20 MG tablet Take 20 mg by mouth every evening.    [provider]    Allergies    Bactrim [sulfamethoxazole-trimethoprim]  Review of Systems   Review of Systems  Constitutional: Negative for chills and fever.  HENT: Negative for congestion, rhinorrhea and sore throat.   Eyes: Negative  for visual disturbance.  Respiratory: Negative for cough and shortness of breath.   Cardiovascular: Negative for chest pain and leg swelling.  Gastrointestinal: Positive for abdominal pain and nausea. Negative for diarrhea and vomiting.  Genitourinary: Negative for dysuria.  Musculoskeletal: Negative for back pain and neck pain.  Skin: Negative for rash.  Neurological: Negative for dizziness, light-headedness and headaches.  Hematological: Does not bruise/bleed easily.  Psychiatric/Behavioral: Negative for confusion.    Physical Exam Updated Vital Signs BP 119/82   Pulse (!) 112   Temp 98 F (36.7 C) (Oral)    Resp 18   Ht 1.626 m (5\' 4" )   Wt 77.1 kg   LMP 11/24/2011   SpO2 96%   BMI 29.18 kg/m   Physical Exam Vitals and nursing note reviewed.  Constitutional:      General: She is not in acute distress.    Appearance: Normal appearance. She is well-developed and well-nourished.  HENT:     Head: Normocephalic and atraumatic.  Eyes:     Extraocular Movements: Extraocular movements intact.     Conjunctiva/sclera: Conjunctivae normal.  Cardiovascular:     Rate and Rhythm: Normal rate and regular rhythm.     Heart sounds: No murmur heard.   Pulmonary:     Effort: Pulmonary effort is normal. No respiratory distress.     Breath sounds: Normal breath sounds.  Abdominal:     Palpations: Abdomen is soft.     Tenderness: There is abdominal tenderness. There is no guarding.     Comments: Midline abdominal incision well-healed.  No obvious hernia or mass.  There is some tenderness to palpation in the epigastric area.  Musculoskeletal:        General: No edema. Normal range of motion.     Cervical back: Normal range of motion and neck supple.  Skin:    General: Skin is warm and dry.     Capillary Refill: Capillary refill takes less than 2 seconds.  Neurological:     General: No focal deficit present.     Mental Status: She is alert and oriented to person, place, and time.     Cranial Nerves: No cranial nerve deficit.     Sensory: No sensory deficit.     Motor: No weakness.  Psychiatric:        Mood and Affect: Mood and affect normal.     ED Results / Procedures / Treatments   Labs (all labs ordered are listed, but only abnormal results are displayed) Labs Reviewed  COMPREHENSIVE METABOLIC PANEL - Abnormal; Notable for the following components:      Result Value   Chloride 97 (*)    Glucose, Bld 108 (*)    Creatinine, Ser 1.08 (*)    AST 62 (*)    ALT 64 (*)    GFR, Estimated 60 (*)    All other components within normal limits  URINALYSIS, ROUTINE W REFLEX MICROSCOPIC -  Abnormal; Notable for the following components:   APPearance CLOUDY (*)    All other components within normal limits  LIPASE, BLOOD  CBC    EKG None  Radiology CT Abdomen Pelvis W Contrast  Result Date: 10/21/2020 CLINICAL DATA:  Abdominal pain and bloating for 4 days. Constipation. Bowel obstruction suspected. Patient reports bowel surgery 2 years ago. EXAM: CT ABDOMEN AND PELVIS WITH CONTRAST TECHNIQUE: Multidetector CT imaging of the abdomen and pelvis was performed using the standard protocol following bolus administration of intravenous contrast. CONTRAST:  143mL OMNIPAQUE IOHEXOL 300  MG/ML  SOLN COMPARISON:  CT 10 days ago at [place]: Lower chest: Lung bases are clear. Minor dependent atelectasis. No pleural fluid. Hepatobiliary: Homogeneous liver attenuation without focal hepatic abnormality. Post cholecystectomy with clips in the gallbladder fossa. Mild intrahepatic biliary ductal prominence is stable from prior exam. Common bile duct is unchanged. No visualized choledocholithiasis. Pancreas: No ductal dilatation or inflammation. Spleen: Normal in size without focal abnormality. Adrenals/Urinary Tract: Normal adrenal glands. No hydronephrosis or perinephric edema. Homogeneous renal enhancement with symmetric excretion on delayed phase imaging. Urinary bladder is partially distended without wall thickening. Stomach/Bowel: Stomach physiologically distended. There is no gastric wall thickening. Normal positioning of the duodenum and ligament of Treitz. Proximal small bowel are decompressed. Distal small bowel are prominent slightly fluid-filled measuring up to 2.7 cm. Softer incision in the anterior abdomen just to the left of midline, series 12, image 47. no mesenteric edema or stranding. Enteric sutures noted in the right lower quadrant small bowel. No terminal ileal inflammation or wall thickening. Mild fecalization of distal small bowel contents. Large volume of stool  throughout the colon. No colonic wall thickening or pericolonic inflammation. Transverse colon is tortuous. Vascular/Lymphatic: Mild aortic atherosclerosis. No aortic aneurysm. No abdominopelvic adenopathy. Reproductive: Status post hysterectomy. No adnexal masses. Other: Postsurgical change of the anterior abdominal wall. Stable small fat containing anterior abdominal wall hernia above the site of surgical repair. No free air or ascites. Musculoskeletal: Lumbar degeneration with degenerative disc disease at L5-S1 and lower lumbar facet hypertrophy. There are no acute or suspicious osseous abnormalities. IMPRESSION: 1. Fluid-filled prominent loops of small bowel in the lower abdomen with soft transition point to the left of midline, raising possibility of early small bowel obstruction. This is likely due to adhesions. 2. Large volume of stool throughout the colon with fecalization of distal small bowel contents, suggesting constipation 3. Postsurgical change of the anterior abdominal wall. Stable small fat containing anterior abdominal wall hernia above the site of surgical repair. Aortic Atherosclerosis (ICD10-I70.0). Electronically Signed   By: Keith Rake M.D.   On: 10/21/2020 16:37    Procedures Procedures (including critical care time)  Medications Ordered in ED Medications  0.9 %  sodium chloride infusion ( Intravenous New Bag/Given 10/21/20 1544)  ondansetron (ZOFRAN) injection 4 mg (4 mg Intravenous Given 10/21/20 1546)  HYDROmorphone (DILAUDID) injection 1 mg (1 mg Intravenous Given 10/21/20 1546)  iohexol (OMNIPAQUE) 300 MG/ML solution 100 mL (100 mLs Intravenous Contrast Given 10/21/20 1554)    ED Course  I have reviewed the triage vital signs and the nursing notes.  Pertinent labs & imaging results that were available during my care of the patient were reviewed by me and considered in my medical decision making (see chart for details).    MDM Rules/Calculators/A&P                           CT scan raises some concerns for early partial small bowel obstruction.  Also there is evidence of a ventral hernia but there is no incarceration or entrapment there.  Patient also with a large amount of stool suggestive of constipation.  Patient's database review shows that she receives monthly supply of narcotic medicine for chronic pain.  Discussed with general surgery Dr. Donne Hazel.  Not too impressed by CT scan.  They will be available for consultation as needed.  They do want to be contacted if the patient goes on to have vomiting or stops passing gas or  has any bowel movements.  Since patient is fairly uncomfortable and the pain is getting worse past 2 days.  Will contact hospitalist for admission.   Final Clinical Impression(s) / ED Diagnoses Final diagnoses:  Partial small bowel obstruction [facility])    Rx / DC Orders ED Discharge Orders    None       Fredia Sorrow, MD 10/21/20 1704

## 2020-10-21 NOTE — ED Triage Notes (Signed)
He reports feeling abd. Pain "sharp stabs" for past few days. She states she underwent herniorrhaphy with mesh insertion and small bowel resection some two years ago "and I've felt these pains occasionally ever since, but it's worse for the past few days. She also mentions having been seen by a physician in [place] ~2 weeks ago, who dx with "colitis" and prescribed Keflex. She is in no distress.

## 2020-10-21 NOTE — ED Notes (Signed)
[place] AT BEDSIDE 

## 2020-10-22 ENCOUNTER — Encounter (HOSPITAL_COMMUNITY): Payer: Self-pay | Admitting: Internal Medicine

## 2020-10-22 ENCOUNTER — Inpatient Hospital Stay (HOSPITAL_COMMUNITY): Payer: Medicare HMO

## 2020-10-22 DIAGNOSIS — I10 Essential (primary) hypertension: Secondary | ICD-10-CM

## 2020-10-22 DIAGNOSIS — R1030 Lower abdominal pain, unspecified: Secondary | ICD-10-CM

## 2020-10-22 DIAGNOSIS — Z720 Tobacco use: Secondary | ICD-10-CM

## 2020-10-22 DIAGNOSIS — H01004 Unspecified blepharitis left upper eyelid: Secondary | ICD-10-CM

## 2020-10-22 DIAGNOSIS — R110 Nausea: Secondary | ICD-10-CM | POA: Diagnosis present

## 2020-10-22 DIAGNOSIS — R109 Unspecified abdominal pain: Secondary | ICD-10-CM | POA: Diagnosis present

## 2020-10-22 DIAGNOSIS — I700 Atherosclerosis of aorta: Secondary | ICD-10-CM

## 2020-10-22 DIAGNOSIS — F418 Other specified anxiety disorders: Secondary | ICD-10-CM

## 2020-10-22 DIAGNOSIS — K5903 Drug induced constipation: Secondary | ICD-10-CM

## 2020-10-22 DIAGNOSIS — K5900 Constipation, unspecified: Secondary | ICD-10-CM | POA: Diagnosis present

## 2020-10-22 LAB — CBC
HCT: 36.9 % (ref 36.0–46.0)
Hemoglobin: 12 g/dL (ref 12.0–15.0)
MCH: 31.8 pg (ref 26.0–34.0)
MCHC: 32.5 g/dL (ref 30.0–36.0)
MCV: 97.9 fL (ref 80.0–100.0)
Platelets: 256 10*3/uL (ref 150–400)
RBC: 3.77 MIL/uL — ABNORMAL LOW (ref 3.87–5.11)
RDW: 12.1 % (ref 11.5–15.5)
WBC: 6.8 10*3/uL (ref 4.0–10.5)
nRBC: 0 % (ref 0.0–0.2)

## 2020-10-22 LAB — COMPREHENSIVE METABOLIC PANEL
ALT: 61 U/L — ABNORMAL HIGH (ref 0–44)
AST: 58 U/L — ABNORMAL HIGH (ref 15–41)
Albumin: 3.4 g/dL — ABNORMAL LOW (ref 3.5–5.0)
Alkaline Phosphatase: 83 U/L (ref 38–126)
Anion gap: 9 (ref 5–15)
BUN: 10 mg/dL (ref 6–20)
CO2: 25 mmol/L (ref 22–32)
Calcium: 8.2 mg/dL — ABNORMAL LOW (ref 8.9–10.3)
Chloride: 103 mmol/L (ref 98–111)
Creatinine, Ser: 0.85 mg/dL (ref 0.44–1.00)
GFR, Estimated: >60 mL/min (ref >60)
Glucose, Bld: 103 mg/dL — ABNORMAL HIGH (ref 70–99)
Potassium: 4.3 mmol/L (ref 3.5–5.1)
Sodium: 137 mmol/L (ref 135–145)
Total Bilirubin: 0.7 mg/dL (ref 0.3–1.2)
Total Protein: 6.4 g/dL — ABNORMAL LOW (ref 6.5–8.1)

## 2020-10-22 LAB — MAGNESIUM: Magnesium: 2.2 mg/dL (ref 1.7–2.4)

## 2020-10-22 LAB — HIV ANTIBODY (ROUTINE TESTING W REFLEX): HIV Screen 4th Generation wRfx: NONREACTIVE

## 2020-10-22 LAB — PHOSPHORUS: Phosphorus: 2.7 mg/dL (ref 2.5–4.6)

## 2020-10-22 MED ORDER — POLYETHYLENE GLYCOL 3350 17 G PO PACK
17.0000 g | PACK | Freq: Two times a day (BID) | ORAL | Status: DC
  Administered 2020-10-22 – 2020-10-23 (×3): 17 g via ORAL
  Filled 2020-10-22 (×3): qty 1

## 2020-10-22 MED ORDER — ARTIFICIAL TEARS OPHTHALMIC OINT
TOPICAL_OINTMENT | OPHTHALMIC | Status: DC | PRN
  Administered 2020-10-22: 1 via OPHTHALMIC
  Filled 2020-10-22: qty 3.5

## 2020-10-22 MED ORDER — LIP MEDEX EX OINT
TOPICAL_OINTMENT | CUTANEOUS | Status: AC
  Filled 2020-10-22: qty 7

## 2020-10-22 MED ORDER — SENNOSIDES-DOCUSATE SODIUM 8.6-50 MG PO TABS
2.0000 | ORAL_TABLET | Freq: Two times a day (BID) | ORAL | Status: DC
  Administered 2020-10-22 – 2020-10-23 (×3): 2 via ORAL
  Filled 2020-10-22 (×3): qty 2

## 2020-10-22 MED ORDER — ERYTHROMYCIN 5 MG/GM OP OINT
1.0000 "application " | TOPICAL_OINTMENT | Freq: Every day | OPHTHALMIC | Status: DC
  Administered 2020-10-22: 1 via OPHTHALMIC
  Filled 2020-10-22: qty 3.5

## 2020-10-22 MED ORDER — ERYTHROMYCIN 5 MG/GM OP OINT
TOPICAL_OINTMENT | Freq: Every evening | OPHTHALMIC | Status: DC
  Filled 2020-10-22: qty 1

## 2020-10-22 MED ORDER — FLEET ENEMA 7-19 GM/118ML RE ENEM: 1.0000 | ENEMA | Freq: Every day | RECTAL | Status: DC | PRN

## 2020-10-22 MED ORDER — ERYTHROMYCIN 5 MG/GM OP OINT
TOPICAL_OINTMENT | Freq: Every day | OPHTHALMIC | Status: DC
  Filled 2020-10-22: qty 1

## 2020-10-22 MED ORDER — LIP MEDEX EX OINT
TOPICAL_OINTMENT | CUTANEOUS | Status: DC | PRN
  Administered 2020-10-22: 1 via TOPICAL

## 2020-10-22 NOTE — Progress Notes (Signed)
[facility]  PROGRESS NOTE  Erin Prince TJQ:[id number] DOB: [birth year] DOA: 10/21/2020 PCP: [name], MD Admit date - 10/21/2020   Admitting Physician Dwyane Dee, MD  Outpatient Primary MD for the patient is Achreja, Dickey Gave, MD  LOS - 1 Brief Narrative  Erin Prince is a 59 year old female with medical history significant for prior hysterectomy and cholecystectomy, depression with anxiety, COPD, GERD, tobacco use who presented with worsening abdominal pain, distention, decreased appetite and nausea without emesis over the past week and was found to be tachycardic with CT imaging suggesting early SBO in the setting of adhesions and large volume of stool.  Patient was admitted with working diagnosis of abdominal pain in the setting of possible early SBO related to adhesions and constipation in the setting of opioid use.  Subjective  Feels a bit better today after big bowel movement this morning.  Still notes abdominal pain does not feel at baseline yet.  Still having nausea without any emesis.  Notes some congestion with her cough.  A & P  Early SBO with abdominal pain and nausea.  Patient had a big BM this morning with some improvement in her abdominal pain.  Still persist with nausea but no emesis.  Abdominal x-ray shows no evidence of bowel obstruction or ileus but still notes large stool burden.  Given patient's increased risk for SBO in setting of prior adhesions, anopia regimen we will slowly start diet and monitor serial abdominal x-rays -Start liquid diet and monitor patient's ability to tolerate p.o. without worsening abdominal pain -Optimize bowel regimen of senna docusate twice daily, MiraLAX twice daily, monitor -Serial abdominal x-ray, repeat in a.m. -Monitor K -Low threshold to give NG tube with emesis x4 worsening abdominal pain -Pain control with as needed IV Dilaudid for severe pain in p.o. oxycodone for moderate pain as needed  Right eyelid  erythema and some tenderness and lid crusting seems consistent with blepharitis. With mild drainage. States noticed after having cold like symptoms after her recent COVID booster. No changes in vision, no eye pain, normal range of motion of eyes.  --closely monitor --start warm compresses, artificial tears and topical antibiotics  Tobacco use with history of COPD.  Current everyday smoker.  Some rhonchi on exam but no overt wheezing -Nicotine patch in place  Depression with anxiety, stable -Continue home amitriptyline  HTN, at goal -continue home lisinopril  GERD, stable -Currently on IV Protonix while assessing ability to tolerate stable p.o.       Family Communication  : None  Code Status : Full  Disposition Plan  :  Patient is from home. Anticipated d/c date: 2 to 3 days. Barriers to d/c or necessity for inpatient status:  Consults  : None  Procedures  : None  DVT Prophylaxis  :  Lovenox   MDM: The below labs and imaging reports were reviewed and summarized above.  Medication management as above.  Lab Results  Component Value Date   PLT 256 10/22/2020    Diet :  Diet Order            Diet clear liquid Room service appropriate? Yes; Fluid consistency: Thin  Diet effective now                  Inpatient Medications Scheduled Meds: . enoxaparin (LOVENOX) injection  40 mg Subcutaneous Q24H  . nicotine  21 mg Transdermal Daily  . pantoprazole (PROTONIX) IV  40 mg Intravenous Q24H  . polyethylene glycol  17  g Oral BID  . senna-docusate  2 tablet Oral BID   Continuous Infusions: . sodium chloride 100 mL/hr at 10/22/20 1107   PRN Meds:.acetaminophen **OR** acetaminophen, HYDROmorphone (DILAUDID) injection, lip balm, ondansetron **OR** ondansetron (ZOFRAN) IV, sodium phosphate  Antibiotics  :   Anti-infectives (From admission, onward)   None       Objective   Vitals:   10/21/20 1842 10/21/20 2018 10/22/20 0526 10/22/20 1235  BP:  (!) 106/55 118/73  (!) 141/74  Pulse:  (!) 109 84 89  Resp:  20 18 (!) 21  Temp: 98.7 F (37.1 C) 98.2 F (36.8 C) 98.2 F (36.8 C) 98.2 F (36.8 C)  TempSrc: Oral Oral Oral Oral  SpO2:  99% 91% 95%  Weight:      Height:        SpO2: 95 % O2 Flow Rate (L/min): 2 L/min  Wt Readings from Last 3 Encounters:  10/21/20 77.1 kg  01/28/17 72.7 kg  01/07/17 73.8 kg     Intake/Output Summary (Last 24 hours) at 10/22/2020 1653 Last data filed at 10/22/2020 1400 Gross per 24 hour  Intake 1164.64 ml  Output --  Net 1164.64 ml    Physical Exam:   Awake Alert, Oriented X 3, Normal affect No new F.N deficits,  [place].AT, Normal respiratory effort on room air, occasional rhonchi otherwise CTAB RRR,No Gallops,Rubs or new Murmurs,  Abd Soft, tenderness in lower quadrant without rebound tenderness or guarding, diminished bowel sounds Redness around eyelid with crusting, normal EOM, slightly injected conjunctiva   I have personally reviewed the following:   Data Reviewed:  CBC Recent Labs  Lab 10/21/20 1355 10/22/20 0521  WBC 8.9 6.8  HGB 13.1 12.0  HCT 40.0 36.9  PLT 290 256  MCV 95.7 97.9  MCH 31.3 31.8  MCHC 32.8 32.5  RDW 11.9 12.1    Chemistries  Recent Labs  Lab 10/21/20 1355 10/22/20 0521  NA 135 137  K 4.3 4.3  CL 97* 103  CO2 29 25  GLUCOSE 108* 103*  BUN 14 10  CREATININE 1.08* 0.85  CALCIUM 9.0 8.2*  MG  --  2.2  AST 62* 58*  ALT 64* 61*  ALKPHOS 85 83  BILITOT 0.4 0.7   ------------------------------------------------------------------------------------------------------------------ No results for input(s): CHOL, HDL, LDLCALC, TRIG, CHOLHDL, LDLDIRECT in the last 72 hours.  No results found for: HGBA1C ------------------------------------------------------------------------------------------------------------------ No results for input(s): TSH, T4TOTAL, T3FREE, THYROIDAB in the last 72 hours.  Invalid input(s):  FREET3 ------------------------------------------------------------------------------------------------------------------ No results for input(s): VITAMINB12, FOLATE, FERRITIN, TIBC, IRON, RETICCTPCT in the last 72 hours.  Coagulation profile No results for input(s): INR, PROTIME in the last 168 hours.  No results for input(s): DDIMER in the last 72 hours.  Cardiac Enzymes No results for input(s): CKMB, TROPONINI, MYOGLOBIN in the last 168 hours.  Invalid input(s): CK ------------------------------------------------------------------------------------------------------------------ No results found for: BNP  Micro Results Recent Results (from the past 240 hour(s))  Resp Panel by RT-PCR (Flu A&B, Covid) Nasopharyngeal Swab     Status: None   Collection Time: 10/21/20  5:08 PM   Specimen: Nasopharyngeal Swab; Nasopharyngeal(NP) swabs in vial transport medium  Result Value Ref Range Status   SARS Coronavirus 2 by RT PCR NEGATIVE NEGATIVE Final    Comment: (NOTE) SARS-CoV-2 target nucleic acids are NOT DETECTED.  The SARS-CoV-2 RNA is generally detectable in upper respiratory specimens during the acute phase of infection. The lowest concentration of SARS-CoV-2 viral copies this assay can detect is 138 copies/mL. A negative  result does not preclude SARS-Cov-2 infection and should not be used as the sole basis for treatment or other patient management decisions. A negative result may occur with  improper specimen collection/handling, submission of specimen other than nasopharyngeal swab, presence of viral mutation(s) within the areas targeted by this assay, and inadequate number of viral copies(<138 copies/mL). A negative result must be combined with clinical observations, patient history, and epidemiological information. The expected result is Negative.  Fact Sheet for Patients:  EntrepreneurPulse.com.au  Fact Sheet for Healthcare Providers:   IncredibleEmployment.be  This test is no t yet approved or cleared by the [place] FDA and  has been authorized for detection and/or diagnosis of SARS-CoV-2 by FDA under an Emergency Use Authorization (EUA). This EUA will remain  in effect (meaning this test can be used) for the duration of the COVID-19 declaration under Section 564(b)(1) of the Act, 21 U.S.C.section 360bbb-3(b)(1), unless the authorization is terminated  or revoked sooner.       Influenza A by PCR NEGATIVE NEGATIVE Final   Influenza B by PCR NEGATIVE NEGATIVE Final    Comment: (NOTE) The Xpert Xpress SARS-CoV-2/FLU/RSV plus assay is intended as an aid in the diagnosis of influenza from Nasopharyngeal swab specimens and should not be used as a sole basis for treatment. Nasal washings and aspirates are unacceptable for Xpert Xpress SARS-CoV-2/FLU/RSV testing.  Fact Sheet for Patients: EntrepreneurPulse.com.au  Fact Sheet for Healthcare Providers: IncredibleEmployment.be  This test is not yet approved or cleared by the [place] FDA and has been authorized for detection and/or diagnosis of SARS-CoV-2 by FDA under an Emergency Use Authorization (EUA). This EUA will remain in effect (meaning this test can be used) for the duration of the COVID-19 declaration under Section 564(b)(1) of the Act, 21 U.S.C. section 360bbb-3(b)(1), unless the authorization is terminated or revoked.  Performed at [facility], [address], [place], [place] [address]     Radiology Reports CT Abdomen Pelvis W Contrast  Result Date: 10/21/2020 CLINICAL DATA:  Abdominal pain and bloating for 4 days. Constipation. Bowel obstruction suspected. Patient reports bowel surgery 2 years ago. EXAM: CT ABDOMEN AND PELVIS WITH CONTRAST TECHNIQUE: Multidetector CT imaging of the abdomen and pelvis was performed using the standard protocol following bolus administration of  intravenous contrast. CONTRAST:  161mL OMNIPAQUE IOHEXOL 300 MG/ML  SOLN COMPARISON:  CT 10 days ago at [place]: Lower chest: Lung bases are clear. Minor dependent atelectasis. No pleural fluid. Hepatobiliary: Homogeneous liver attenuation without focal hepatic abnormality. Post cholecystectomy with clips in the gallbladder fossa. Mild intrahepatic biliary ductal prominence is stable from prior exam. Common bile duct is unchanged. No visualized choledocholithiasis. Pancreas: No ductal dilatation or inflammation. Spleen: Normal in size without focal abnormality. Adrenals/Urinary Tract: Normal adrenal glands. No hydronephrosis or perinephric edema. Homogeneous renal enhancement with symmetric excretion on delayed phase imaging. Urinary bladder is partially distended without wall thickening. Stomach/Bowel: Stomach physiologically distended. There is no gastric wall thickening. Normal positioning of the duodenum and ligament of Treitz. Proximal small bowel are decompressed. Distal small bowel are prominent slightly fluid-filled measuring up to 2.7 cm. Softer incision in the anterior abdomen just to the left of midline, series 12, image 47. no mesenteric edema or stranding. Enteric sutures noted in the right lower quadrant small bowel. No terminal ileal inflammation or wall thickening. Mild fecalization of distal small bowel contents. Large volume of stool throughout the colon. No colonic wall thickening or pericolonic inflammation. Transverse colon is tortuous. Vascular/Lymphatic:  Mild aortic atherosclerosis. No aortic aneurysm. No abdominopelvic adenopathy. Reproductive: Status post hysterectomy. No adnexal masses. Other: Postsurgical change of the anterior abdominal wall. Stable small fat containing anterior abdominal wall hernia above the site of surgical repair. No free air or ascites. Musculoskeletal: Lumbar degeneration with degenerative disc disease at L5-S1 and lower lumbar facet hypertrophy.  There are no acute or suspicious osseous abnormalities. IMPRESSION: 1. Fluid-filled prominent loops of small bowel in the lower abdomen with soft transition point to the left of midline, raising possibility of early small bowel obstruction. This is likely due to adhesions. 2. Large volume of stool throughout the colon with fecalization of distal small bowel contents, suggesting constipation 3. Postsurgical change of the anterior abdominal wall. Stable small fat containing anterior abdominal wall hernia above the site of surgical repair. Aortic Atherosclerosis (ICD10-I70.0). Electronically Signed   By: Keith Rake M.D.   On: 10/21/2020 16:37   DG Abd Portable 1V  Result Date: 10/22/2020 CLINICAL DATA:  Small bowel obstruction. EXAM: PORTABLE ABDOMEN - 1 VIEW COMPARISON:  October 11, 2020. FINDINGS: The bowel gas pattern is normal. Large amount of stool seen throughout the colon. Residual contrast is noted in urinary bladder. No radio-opaque calculi or other significant radiographic abnormality are seen. IMPRESSION: No evidence of bowel obstruction or ileus. Large stool burden is noted. Electronically Signed   By: Marijo Conception M.D.   On: 10/22/2020 11:35     Time Spent in minutes  30     Desiree Hane M.D on 10/22/2020 at 4:53 PM  To page go to [email or web address] - password [facility]

## 2020-10-22 NOTE — Plan of Care (Signed)
  Problem: Coping: Goal: Level of anxiety will decrease Outcome: Progressing   Problem: Elimination: Goal: Will not experience complications related to bowel motility Outcome: Progressing Goal: Will not experience complications related to urinary retention Outcome: Completed/Met   Problem: Pain Managment: Goal: General experience of comfort will improve Outcome: Progressing   Problem: Safety: Goal: Ability to remain free from injury will improve Outcome: Completed/Met   Problem: Skin Integrity: Goal: Risk for impaired skin integrity will decrease Outcome: Completed/Met   Problem: Education: Goal: Knowledge of General Education information will improve Description: Including pain rating scale, medication(s)/side effects and non-pharmacologic comfort measures Outcome: Completed/Met   Problem: Clinical Measurements: Goal: Ability to maintain clinical measurements within normal limits will improve Outcome: Progressing Goal: Respiratory complications will improve Outcome: Completed/Met Goal: Cardiovascular complication will be avoided Outcome: Completed/Met   Problem: Activity: Goal: Risk for activity intolerance will decrease Outcome: Completed/Met

## 2020-10-22 NOTE — Plan of Care (Signed)
  Problem: Education: Goal: Knowledge of General Education information will improve Description: Including pain rating scale, medication(s)/side effects and non-pharmacologic comfort measures Outcome: Completed/Met   Problem: Clinical Measurements: Goal: Ability to maintain clinical measurements within normal limits will improve Outcome: Progressing Goal: Respiratory complications will improve Outcome: Completed/Met Goal: Cardiovascular complication will be avoided Outcome: Completed/Met   Problem: Activity: Goal: Risk for activity intolerance will decrease Outcome: Completed/Met   Problem: Coping: Goal: Level of anxiety will decrease Outcome: Progressing   Problem: Elimination: Goal: Will not experience complications related to bowel motility Outcome: Progressing Goal: Will not experience complications related to urinary retention Outcome: Completed/Met   Problem: Pain Managment: Goal: General experience of comfort will improve Outcome: Progressing   Problem: Safety: Goal: Ability to remain free from injury will improve Outcome: Completed/Met   Problem: Skin Integrity: Goal: Risk for impaired skin integrity will decrease Outcome: Completed/Met

## 2020-10-23 ENCOUNTER — Inpatient Hospital Stay (HOSPITAL_COMMUNITY): Payer: Medicare HMO

## 2020-10-23 DIAGNOSIS — R748 Abnormal levels of other serum enzymes: Secondary | ICD-10-CM

## 2020-10-23 DIAGNOSIS — K219 Gastro-esophageal reflux disease without esophagitis: Secondary | ICD-10-CM

## 2020-10-23 LAB — COMPREHENSIVE METABOLIC PANEL
ALT: 54 U/L — ABNORMAL HIGH (ref 0–44)
AST: 40 U/L (ref 15–41)
Albumin: 3.3 g/dL — ABNORMAL LOW (ref 3.5–5.0)
Alkaline Phosphatase: 80 U/L (ref 38–126)
Anion gap: 10 (ref 5–15)
BUN: 6 mg/dL (ref 6–20)
CO2: 25 mmol/L (ref 22–32)
Calcium: 8.2 mg/dL — ABNORMAL LOW (ref 8.9–10.3)
Chloride: 103 mmol/L (ref 98–111)
Creatinine, Ser: 0.68 mg/dL (ref 0.44–1.00)
GFR, Estimated: >60 mL/min (ref >60)
Glucose, Bld: 103 mg/dL — ABNORMAL HIGH (ref 70–99)
Potassium: 3.9 mmol/L (ref 3.5–5.1)
Sodium: 138 mmol/L (ref 135–145)
Total Bilirubin: 0.8 mg/dL (ref 0.3–1.2)
Total Protein: 6.4 g/dL — ABNORMAL LOW (ref 6.5–8.1)

## 2020-10-23 LAB — CBC
HCT: 36.9 % (ref 36.0–46.0)
Hemoglobin: 12.1 g/dL (ref 12.0–15.0)
MCH: 31.1 pg (ref 26.0–34.0)
MCHC: 32.8 g/dL (ref 30.0–36.0)
MCV: 94.9 fL (ref 80.0–100.0)
Platelets: 243 10*3/uL (ref 150–400)
RBC: 3.89 MIL/uL (ref 3.87–5.11)
RDW: 11.8 % (ref 11.5–15.5)
WBC: 6.5 10*3/uL (ref 4.0–10.5)
nRBC: 0 % (ref 0.0–0.2)

## 2020-10-23 MED ORDER — NICOTINE 21 MG/24HR TD PT24: 21.0000 mg | MEDICATED_PATCH | Freq: Every day | TRANSDERMAL | 0 refills | Status: AC

## 2020-10-23 MED ORDER — ARTIFICIAL TEARS OPHTHALMIC OINT: TOPICAL_OINTMENT | OPHTHALMIC | 0 refills | Status: AC | PRN

## 2020-10-23 MED ORDER — ERYTHROMYCIN 5 MG/GM OP OINT: 1.0000 "application " | TOPICAL_OINTMENT | Freq: Every day | OPHTHALMIC | 0 refills | Status: AC

## 2020-10-23 NOTE — Discharge Instructions (Signed)
You were admitted for abdominal pain that occurred due to constipation.  It is important while you are taking your pain medication that you take stool softeners and even laxatives to help keep your bowel movements normal.  You had redness and pain of your right upper lid likely from an infected stye this is called blepharitis. For treatment please continue to use the artificial tears, warm compresses, the azithromycin eye ointment every evening for the next 13 days.  These medications were sent to your pharmacy

## 2020-10-23 NOTE — Discharge Summary (Signed)
[name]:[id number] DOB: January 12, 1961 DOA: 10/21/2020  PCP: [name], MD  Admit date: 10/21/2020 Discharge date: 10/23/2020  Admitted From: Home Disposition: Home  Recommendations for Outpatient Follow-up:  1. Follow up with PCP in 1-2 weeks 2. Continue azithromycin ophthalmic ointment for blepharitis for 2 weeks 3. Encouraged to use stool softener/laxative regimen while on opioid regimen at home 4. Please obtain BMP/CBC in one week 5. Please follow up on the following pending results:  Home Health: None Equipment/Devices: None  Discharge Condition: Stable CODE STATUS: Full Diet recommendation: Regular  Brief/Interim Summary: History of present illness:  Erin Prince is a 59 year old female with medical history significant for prior hysterectomy and cholecystectomy, depression with anxiety, COPD, GERD, tobacco use who presented with worsening abdominal pain, distention, decreased appetite and nausea without emesis over the past week and was found to be tachycardic with CT imaging suggesting early SBO in the setting of adhesions and large volume of stool.  Patient was admitted with working diagnosis of abdominal pain in the setting of possible early SBO related to adhesions and constipation in the setting of opioid use. Hospital Course:   Early SBO with abdominal pain and nausea, resolved.  Occurred in setting of opioid regimen without consistent use of stool softeners.  Initial CT imaging was concerning for early SBO repeat abdominal x-rays x2 showed constipation without any obstruction.  With optimization of bowel regimen patient had several normal bowel movements with resolution of abdominal pain.  Patient was able to tolerate soft diet on day of discharge without any nausea or vomiting or abdominal pain. -Encourage patient to continue to use stool softeners(she has them at home but had not been using them consistently) while using opioid regimen on discharge  especially in light of patient's previous abdominal surgery history.   Right eyelid erythema and some tenderness and lid crusting seems consistent with blepharitis, greatly improved.  Initially noted to have mild drainage with pain and erythema that she states has been ongoing for several days after having cold like symptoms after her recent COVID booster. No changes in vision, no eye pain, normal range of motion of eyes.  Improvement in symptoms with initiation of topical antibiotics --closely monitor --Supportive care with warm compresses, artificial tears and topical erythromycin ophthalmic ointment (for 2 weeks)  Tobacco use with history of COPD.  Current everyday smoker.  Some rhonchi on exam but no overt wheezing -Nicotine patch in place -Smoking cessation encouraged  Depression with anxiety, stable -Continue home amitriptyline  HTN, at goal -continue home lisinopril  GERD, stable Briefly required IV Protonix while n.p.o. -Can resume home PPI     Consultations:  None   Subjective: Feels well.  Tolerated soft diet.  No longer nauseous.  Has not had any vomiting episodes.  Ready to go home Discharge Exam: Vitals:   10/22/20 1235 10/22/20 2107  BP: (!) 141/74 128/89  Pulse: 89 85  Resp: (!) 21 18  Temp: 98.2 F (36.8 C) 97.9 F (36.6 C)  SpO2: 95% 95%   Vitals:   10/21/20 2018 10/22/20 0526 10/22/20 1235 10/22/20 2107  BP: (!) 106/55 118/73 (!) 141/74 128/89  Pulse: (!) 109 84 89 85  Resp: 20 18 (!) 21 18  Temp: 98.2 F (36.8 C) 98.2 F (36.8 C) 98.2 F (36.8 C) 97.9 F (36.6 C)  TempSrc: Oral Oral Oral Oral  SpO2: 99% 91% 95% 95%  Weight:      Height:        General: Lying  in bed, no apparent distress Eyes: EOMI, anicteric, slight erythema with no drainage on right upper eyelid ENT: Oral Mucosa clear and moist Cardiovascular: regular rate and rhythm, no murmurs, rubs or gallops, no edema, Respiratory: Normal respiratory effort on room air, lungs  clear to auscultation bilaterally Abdomen: soft, non-distended, non-tender, normal bowel sounds Skin: No Rash Neurologic: Grossly no focal neuro deficit.Mental status AAOx3, speech normal, Psychiatric:Appropriate affect, and mood  Discharge Diagnoses:  Principal Problem:   Partial small bowel obstruction (HCC) Active Problems:   Essential hypertension   Depression with anxiety   GERD (gastroesophageal reflux disease)   COPD (chronic obstructive pulmonary disease) (HCC)   Hyperlipidemia   Elevated liver enzymes   Tobacco use   Aortic atherosclerosis (HCC)   Abdominal pain   Constipation   Nausea    Discharge Instructions  Discharge Instructions    Diet - low sodium heart healthy   Complete by: As directed    Increase activity slowly   Complete by: As directed      Allergies as of 10/23/2020      Reactions   Bactrim [sulfamethoxazole-trimethoprim] Anaphylaxis   Anaphylaxis    Nsaids    Other reaction(s): Other (See Comments)   Tramadol    Other reaction(s): Other (See Comments)      Medication List    STOP taking these medications   ciprofloxacin 500 MG tablet Commonly known as: CIPRO     TAKE these medications   amitriptyline 50 MG tablet Commonly known as: ELAVIL Take 50 mg by mouth at bedtime.   artificial tears Oint ophthalmic ointment Commonly known as: LACRILUBE Place into the left eye every 4 (four) hours as needed for dry eyes.   cyclobenzaprine 10 MG tablet Commonly known as: FLEXERIL Take 10 mg by mouth at bedtime.   erythromycin ophthalmic ointment Place 1 application into the left eye at bedtime for 13 days.   estradiol 0.5 MG tablet Commonly known as: ESTRACE Take 0.5 mg by mouth daily.   gabapentin 300 MG capsule Commonly known as: NEURONTIN Take 300 mg by mouth 3 (three) times daily.   lisinopril 20 MG tablet Commonly known as: ZESTRIL Take 20 mg by mouth daily.   nicotine 21 mg/24hr patch Commonly known as: NICODERM CQ -  dosed in mg/24 hours Place 1 patch (21 mg total) onto the skin daily. Start taking on: October 24, 2020   ondansetron 4 MG disintegrating tablet Commonly known as: ZOFRAN-ODT Take 4 mg by mouth every 6 (six) hours as needed for nausea/vomiting.   Oxycodone HCl 10 MG Tabs Take 10 mg by mouth every 8 (eight) hours as needed (pain).   pantoprazole 40 MG tablet Commonly known as: PROTONIX Take 40 mg by mouth daily.       Allergies  Allergen Reactions  . Bactrim [Sulfamethoxazole-Trimethoprim] Anaphylaxis    Anaphylaxis    . Nsaids     Other reaction(s): Other (See Comments)  . Tramadol     Other reaction(s): Other (See Comments)        The results of significant diagnostics from this hospitalization (including imaging, microbiology, ancillary and laboratory) are listed below for reference.     Microbiology: Recent Results (from the past 240 hour(s))  Resp Panel by RT-PCR (Flu A&B, Covid) Nasopharyngeal Swab     Status: None   Collection Time: 10/21/20  5:08 PM   Specimen: Nasopharyngeal Swab; Nasopharyngeal(NP) swabs in vial transport medium  Result Value Ref Range Status   SARS Coronavirus 2 by RT PCR  NEGATIVE NEGATIVE Final    Comment: (NOTE) SARS-CoV-2 target nucleic acids are NOT DETECTED.  The SARS-CoV-2 RNA is generally detectable in upper respiratory specimens during the acute phase of infection. The lowest concentration of SARS-CoV-2 viral copies this assay can detect is 138 copies/mL. A negative result does not preclude SARS-Cov-2 infection and should not be used as the sole basis for treatment or other patient management decisions. A negative result may occur with  improper specimen collection/handling, submission of specimen other than nasopharyngeal swab, presence of viral mutation(s) within the areas targeted by this assay, and inadequate number of viral copies(<138 copies/mL). A negative result must be combined with clinical observations, patient  history, and epidemiological information. The expected result is Negative.  Fact Sheet for Patients:  EntrepreneurPulse.com.au  Fact Sheet for Healthcare Providers:  IncredibleEmployment.be  This test is no t yet approved or cleared by the [place] FDA and  has been authorized for detection and/or diagnosis of SARS-CoV-2 by FDA under an Emergency Use Authorization (EUA). This EUA will remain  in effect (meaning this test can be used) for the duration of the COVID-19 declaration under Section 564(b)(1) of the Act, 21 U.S.C.section 360bbb-3(b)(1), unless the authorization is terminated  or revoked sooner.       Influenza A by PCR NEGATIVE NEGATIVE Final   Influenza B by PCR NEGATIVE NEGATIVE Final    Comment: (NOTE) The Xpert Xpress SARS-CoV-2/FLU/RSV plus assay is intended as an aid in the diagnosis of influenza from Nasopharyngeal swab specimens and should not be used as a sole basis for treatment. Nasal washings and aspirates are unacceptable for Xpert Xpress SARS-CoV-2/FLU/RSV testing.  Fact Sheet for Patients: EntrepreneurPulse.com.au  Fact Sheet for Healthcare Providers: IncredibleEmployment.be  This test is not yet approved or cleared by the [place] FDA and has been authorized for detection and/or diagnosis of SARS-CoV-2 by FDA under an Emergency Use Authorization (EUA). This EUA will remain in effect (meaning this test can be used) for the duration of the COVID-19 declaration under Section 564(b)(1) of the Act, 21 U.S.C. section 360bbb-3(b)(1), unless the authorization is terminated or revoked.  Performed at [facility], [address] [place], [place] [address]      Labs: BNP (last 3 results) No results for input(s): BNP in the last 8760 hours. Basic Metabolic Panel: Recent Labs  Lab 10/21/20 1355 10/22/20 0521 10/23/20 0502  NA 135 137 138  K 4.3 4.3 3.9  CL  97* 103 103  CO2 29 25 25   GLUCOSE 108* 103* 103*  BUN 14 10 6   CREATININE 1.08* 0.85 0.68  CALCIUM 9.0 8.2* 8.2*  MG  --  2.2  --   PHOS  --  2.7  --    Liver Function Tests: Recent Labs  Lab 10/21/20 1355 10/22/20 0521 10/23/20 0502  AST 62* 58* 40  ALT 64* 61* 54*  ALKPHOS 85 83 80  BILITOT 0.4 0.7 0.8  PROT 7.7 6.4* 6.4*  ALBUMIN 4.2 3.4* 3.3*   Recent Labs  Lab 10/21/20 1355  LIPASE 19   No results for input(s): AMMONIA in the last 168 hours. CBC: Recent Labs  Lab 10/21/20 1355 10/22/20 0521 10/23/20 0502  WBC 8.9 6.8 6.5  HGB 13.1 12.0 12.1  HCT 40.0 36.9 36.9  MCV 95.7 97.9 94.9  PLT 290 256 243   Cardiac Enzymes: No results for input(s): CKTOTAL, CKMB, CKMBINDEX, TROPONINI in the last 168 hours. BNP: Invalid input(s): POCBNP CBG: No results for input(s): GLUCAP  in the last 168 hours. D-Dimer No results for input(s): DDIMER in the last 72 hours. Hgb A1c No results for input(s): HGBA1C in the last 72 hours. Lipid Profile No results for input(s): CHOL, HDL, LDLCALC, TRIG, CHOLHDL, LDLDIRECT in the last 72 hours. Thyroid function studies No results for input(s): TSH, T4TOTAL, T3FREE, THYROIDAB in the last 72 hours.  Invalid input(s): FREET3 Anemia work up No results for input(s): VITAMINB12, FOLATE, FERRITIN, TIBC, IRON, RETICCTPCT in the last 72 hours. Urinalysis    Component Value Date/Time   COLORURINE YELLOW 10/21/2020 1355   APPEARANCEUR CLOUDY (A) 10/21/2020 1355   LABSPEC 1.025 10/21/2020 1355   PHURINE 6.0 10/21/2020 1355   GLUCOSEU NEGATIVE 10/21/2020 1355   HGBUR NEGATIVE 10/21/2020 1355   [place] 10/21/2020 1355   KETONESUR NEGATIVE 10/21/2020 1355   PROTEINUR NEGATIVE 10/21/2020 1355   NITRITE NEGATIVE 10/21/2020 1355   LEUKOCYTESUR NEGATIVE 10/21/2020 1355   Sepsis Labs Invalid input(s): PROCALCITONIN,  WBC,  LACTICIDVEN Microbiology Recent Results (from the past 240 hour(s))  Resp Panel by RT-PCR (Flu A&B,  Covid) Nasopharyngeal Swab     Status: None   Collection Time: 10/21/20  5:08 PM   Specimen: Nasopharyngeal Swab; Nasopharyngeal(NP) swabs in vial transport medium  Result Value Ref Range Status   SARS Coronavirus 2 by RT PCR NEGATIVE NEGATIVE Final    Comment: (NOTE) SARS-CoV-2 target nucleic acids are NOT DETECTED.  The SARS-CoV-2 RNA is generally detectable in upper respiratory specimens during the acute phase of infection. The lowest concentration of SARS-CoV-2 viral copies this assay can detect is 138 copies/mL. A negative result does not preclude SARS-Cov-2 infection and should not be used as the sole basis for treatment or other patient management decisions. A negative result may occur with  improper specimen collection/handling, submission of specimen other than nasopharyngeal swab, presence of viral mutation(s) within the areas targeted by this assay, and inadequate number of viral copies(<138 copies/mL). A negative result must be combined with clinical observations, patient history, and epidemiological information. The expected result is Negative.  Fact Sheet for Patients:  EntrepreneurPulse.com.au  Fact Sheet for Healthcare Providers:  IncredibleEmployment.be  This test is no t yet approved or cleared by the [place] FDA and  has been authorized for detection and/or diagnosis of SARS-CoV-2 by FDA under an Emergency Use Authorization (EUA). This EUA will remain  in effect (meaning this test can be used) for the duration of the COVID-19 declaration under Section 564(b)(1) of the Act, 21 U.S.C.section 360bbb-3(b)(1), unless the authorization is terminated  or revoked sooner.       Influenza A by PCR NEGATIVE NEGATIVE Final   Influenza B by PCR NEGATIVE NEGATIVE Final    Comment: (NOTE) The Xpert Xpress SARS-CoV-2/FLU/RSV plus assay is intended as an aid in the diagnosis of influenza from Nasopharyngeal swab specimens and should  not be used as a sole basis for treatment. Nasal washings and aspirates are unacceptable for Xpert Xpress SARS-CoV-2/FLU/RSV testing.  Fact Sheet for Patients: EntrepreneurPulse.com.au  Fact Sheet for Healthcare Providers: IncredibleEmployment.be  This test is not yet approved or cleared by the [place] FDA and has been authorized for detection and/or diagnosis of SARS-CoV-2 by FDA under an Emergency Use Authorization (EUA). This EUA will remain in effect (meaning this test can be used) for the duration of the COVID-19 declaration under Section 564(b)(1) of the Act, 21 U.S.C. section 360bbb-3(b)(1), unless the authorization is terminated or revoked.  Performed at [facility], [address] [place], [place] [address]      Time coordinating discharge: Over 30 minutes  SIGNED:   Desiree Hane, MD  [facility] 10/23/2020, 1:28 PM Pager   If 7PM-7AM, please contact night-coverage [email or web address] Password [email or web address]

## 2020-10-23 NOTE — Progress Notes (Signed)
AVS reviewed with patient. All new medications discussed with patient and patient voiced understanding. Patient educated on sticking to bowel regimen to assist with constipation and prevent bowel obstruction. Patient voiced understanding.  IV access removed. Dressing applied.   All patient belongings sent with patient home. Husband at bedside to transport patient home via private vehicle.

## 2021-02-14 ENCOUNTER — Emergency Department (HOSPITAL_BASED_OUTPATIENT_CLINIC_OR_DEPARTMENT_OTHER): Payer: Medicare HMO

## 2021-02-14 ENCOUNTER — Other Ambulatory Visit: Payer: Self-pay

## 2021-02-14 ENCOUNTER — Emergency Department (HOSPITAL_BASED_OUTPATIENT_CLINIC_OR_DEPARTMENT_OTHER)
Admission: EM | Admit: 2021-02-14 | Discharge: 2021-02-14 | Disposition: A | Discharge status: Home / Self Care | Payer: Medicare HMO | Attending: Emergency Medicine | Admitting: Emergency Medicine

## 2021-02-14 ENCOUNTER — Encounter (HOSPITAL_BASED_OUTPATIENT_CLINIC_OR_DEPARTMENT_OTHER): Payer: Self-pay

## 2021-02-14 DIAGNOSIS — Z79899 Other long term (current) drug therapy: Secondary | ICD-10-CM | POA: Diagnosis not present

## 2021-02-14 DIAGNOSIS — K219 Gastro-esophageal reflux disease without esophagitis: Secondary | ICD-10-CM | POA: Diagnosis not present

## 2021-02-14 DIAGNOSIS — Z859 Personal history of malignant neoplasm, unspecified: Secondary | ICD-10-CM | POA: Diagnosis not present

## 2021-02-14 DIAGNOSIS — J449 Chronic obstructive pulmonary disease, unspecified: Secondary | ICD-10-CM | POA: Diagnosis not present

## 2021-02-14 DIAGNOSIS — I10 Essential (primary) hypertension: Secondary | ICD-10-CM | POA: Insufficient documentation

## 2021-02-14 DIAGNOSIS — K5900 Constipation, unspecified: Secondary | ICD-10-CM | POA: Diagnosis not present

## 2021-02-14 DIAGNOSIS — R1032 Left lower quadrant pain: Secondary | ICD-10-CM | POA: Diagnosis present

## 2021-02-14 DIAGNOSIS — F17210 Nicotine dependence, cigarettes, uncomplicated: Secondary | ICD-10-CM | POA: Diagnosis not present

## 2021-02-14 HISTORY — DX: Acute pancreatitis without necrosis or infection, unspecified: K85.90

## 2021-02-14 LAB — URINALYSIS, ROUTINE W REFLEX MICROSCOPIC
Bilirubin Urine: NEGATIVE
Glucose, UA: 100 mg/dL — AB
Hgb urine dipstick: NEGATIVE
Ketones, ur: NEGATIVE mg/dL
Leukocytes,Ua: NEGATIVE
Nitrite: NEGATIVE
Protein, ur: NEGATIVE mg/dL
Specific Gravity, Urine: <1.005 — ABNORMAL LOW (ref 1.005–1.030)
pH: 6 (ref 5.0–8.0)

## 2021-02-14 LAB — COMPREHENSIVE METABOLIC PANEL
ALT: 33 U/L (ref 0–44)
AST: 21 U/L (ref 15–41)
Albumin: 3.9 g/dL (ref 3.5–5.0)
Alkaline Phosphatase: 75 U/L (ref 38–126)
Anion gap: 8 (ref 5–15)
BUN: 7 mg/dL (ref 6–20)
CO2: 24 mmol/L (ref 22–32)
Calcium: 8.9 mg/dL (ref 8.9–10.3)
Chloride: 108 mmol/L (ref 98–111)
Creatinine, Ser: 0.68 mg/dL (ref 0.44–1.00)
GFR, Estimated: >60 mL/min (ref >60)
Glucose, Bld: 80 mg/dL (ref 70–99)
Potassium: 3.8 mmol/L (ref 3.5–5.1)
Sodium: 140 mmol/L (ref 135–145)
Total Bilirubin: 0.3 mg/dL (ref 0.3–1.2)
Total Protein: 7.4 g/dL (ref 6.5–8.1)

## 2021-02-14 LAB — CBC
HCT: 41.9 % (ref 36.0–46.0)
Hemoglobin: 14.6 g/dL (ref 12.0–15.0)
MCH: 31.7 pg (ref 26.0–34.0)
MCHC: 34.8 g/dL (ref 30.0–36.0)
MCV: 90.9 fL (ref 80.0–100.0)
Platelets: 353 10*3/uL (ref 150–400)
RBC: 4.61 MIL/uL (ref 3.87–5.11)
RDW: 12.4 % (ref 11.5–15.5)
WBC: 11 10*3/uL — ABNORMAL HIGH (ref 4.0–10.5)
nRBC: 0 % (ref 0.0–0.2)

## 2021-02-14 LAB — LIPASE, BLOOD: Lipase: 25 U/L (ref 11–51)

## 2021-02-14 LAB — PREGNANCY, URINE: Preg Test, Ur: NEGATIVE

## 2021-02-14 MED ORDER — FENTANYL CITRATE (PF) 100 MCG/2ML IJ SOLN
100.0000 ug | Freq: Once | INTRAMUSCULAR | Status: AC
  Administered 2021-02-14: 100 ug via INTRAVENOUS
  Filled 2021-02-14: qty 2

## 2021-02-14 MED ORDER — ONDANSETRON HCL 4 MG/2ML IJ SOLN
4.0000 mg | Freq: Once | INTRAMUSCULAR | Status: AC
  Administered 2021-02-14: 4 mg via INTRAVENOUS
  Filled 2021-02-14: qty 2

## 2021-02-14 MED ORDER — IOHEXOL 300 MG/ML  SOLN
100.0000 mL | Freq: Once | INTRAMUSCULAR | Status: AC | PRN
  Administered 2021-02-14: 100 mL via INTRAVENOUS

## 2021-02-14 MED ORDER — SODIUM CHLORIDE 0.9 % IV BOLUS
1000.0000 mL | Freq: Once | INTRAVENOUS | Status: AC
  Administered 2021-02-14: 1000 mL via INTRAVENOUS

## 2021-02-14 NOTE — ED Triage Notes (Signed)
Pt c/o abd pain x 2-3 days-nausea-denies v/d-NAD-steady gait

## 2021-02-14 NOTE — Discharge Instructions (Addendum)
For a more aggressive constipation regimen, you may increase your home miralax dosing to 4 caps in one 32 oz bottle of gatorade, then take 3 caps the next day, 2 caps the day after that until you have results.

## 2021-02-14 NOTE — ED Provider Notes (Signed)
[facility] EMERGENCY DEPARTMENT Provider Note   CSN: [id number] Arrival date & time: 02/14/21  1743     History Chief Complaint  Patient presents with  . Abdominal Pain    Erin Prince is a 60 y.o. female.  HPI      Diffuse abdominal pain, worse LLQ Feels like prior SBO Pain severe, cramping  Last BM 4 days ago Nausea, no vomiting Not passing flatus Low appetitie No fevers, no urinary symptoms On miralax because of pain medications.   Past Medical History:  Diagnosis Date  . Cancer ([place])   . COPD (chronic obstructive pulmonary disease) ([place]) 01/05/2017  . Depression with anxiety 01/05/2017  . GERD (gastroesophageal reflux disease) 01/05/2017  . Hyperlipidemia 01/05/2017  . Hypertension   . Pancreatitis   . Tobacco use 10/21/2020    Patient Active Problem List   Diagnosis Date Noted  . Aortic atherosclerosis ([place]) 10/22/2020  . Abdominal pain 10/22/2020  . Constipation 10/22/2020  . Nausea 10/22/2020  . Partial small bowel obstruction ([place]) 10/21/2020  . Tobacco use 10/21/2020  . Dilation of biliary tract   . Elevated lipase   . Elevated liver enzymes   . Pancreatitis, acute 01/05/2017  . Hypokalemia 01/05/2017  . Essential hypertension 01/05/2017  . Depression with anxiety 01/05/2017  . GERD (gastroesophageal reflux disease) 01/05/2017  . COPD (chronic obstructive pulmonary disease) ([place]) 01/05/2017  . Hyperlipidemia 01/05/2017  . Acute pancreatitis 01/05/2017    Past Surgical History:  Procedure Laterality Date  . ABDOMINAL HYSTERECTOMY    . ABDOMINAL SURGERY    . CHOLECYSTECTOMY    . SMALL INTESTINE SURGERY       OB History   No obstetric history on file.     Family History  Problem Relation Age of Onset  . Diabetes Mellitus II Mother   . Lung cancer Father   . Kidney disease Sister   . Pancreatic disease Neg Hx     Social History   Tobacco Use  . Smoking status: Current Every Day Smoker    Types: Cigarettes  .  Smokeless tobacco: Never Used  Substance Use Topics  . Alcohol use: No  . Drug use: Yes    Types: Marijuana    Home Medications Prior to Admission medications   Medication Sig Start Date End Date Taking? Authorizing Provider  amitriptyline (ELAVIL) 50 MG tablet Take 50 mg by mouth at bedtime.    [provider]  artificial tears (LACRILUBE) OINT ophthalmic ointment Place into the left eye every 4 (four) hours as needed for dry eyes. 10/23/20   Desiree Hane, MD  cyclobenzaprine (FLEXERIL) 10 MG tablet Take 10 mg by mouth at bedtime. 10/13/20   [provider]  estradiol (ESTRACE) 0.5 MG tablet Take 0.5 mg by mouth daily.    [provider]  gabapentin (NEURONTIN) 300 MG capsule Take 300 mg by mouth 3 (three) times daily.    [provider]  lisinopril (PRINIVIL,ZESTRIL) 20 MG tablet Take 20 mg by mouth daily. 11/29/16   [provider]  nicotine (NICODERM CQ - DOSED IN MG/24 HOURS) 21 mg/24hr patch Place 1 patch (21 mg total) onto the skin daily. 10/24/20   Desiree Hane, MD  ondansetron (ZOFRAN-ODT) 4 MG disintegrating tablet Take 4 mg by mouth every 6 (six) hours as needed for nausea/vomiting. 10/12/20   [provider]  Oxycodone HCl 10 MG TABS Take 10 mg by mouth every 8 (eight) hours as needed (pain). 10/16/20   [provider]  pantoprazole (PROTONIX) 40 MG tablet Take 40 mg by mouth daily. 11/29/16   [provider]    Allergies    Bactrim [sulfamethoxazole-trimethoprim], Nsaids, and Tramadol  Review of Systems   Review of Systems  Constitutional: Negative for fever.  Respiratory: Negative for cough and shortness of breath.   Cardiovascular: Negative for chest pain.  Gastrointestinal: Positive for abdominal pain, constipation and nausea. Negative for vomiting.  Genitourinary: Negative for difficulty urinating.  Skin: Negative for rash.  Neurological: Negative for syncope and headaches.    Physical  Exam Updated Vital Signs BP (!) 152/98 (BP Location: Right Arm)   Pulse 85   Temp 98.3 F (36.8 C)   Resp 18   Ht 5\' 2"  (1.575 m)   Wt 77.6 kg   LMP 11/24/2011   SpO2 100%   BMI 31.28 kg/m   Physical Exam Vitals and nursing note reviewed.  Constitutional:      General: She is not in acute distress.    Appearance: She is well-developed. She is not diaphoretic.  HENT:     Head: Normocephalic and atraumatic.  Eyes:     Conjunctiva/sclera: Conjunctivae normal.  Cardiovascular:     Rate and Rhythm: Normal rate and regular rhythm.     Heart sounds: Normal heart sounds. No murmur heard. No friction rub. No gallop.   Pulmonary:     Effort: Pulmonary effort is normal. No respiratory distress.     Breath sounds: Normal breath sounds. No wheezing or rales.  Abdominal:     General: There is no distension.     Palpations: Abdomen is soft.     Tenderness: There is generalized abdominal tenderness and tenderness in the left lower quadrant. There is no guarding.  Musculoskeletal:        General: No tenderness.     Cervical back: Normal range of motion.  Skin:    General: Skin is warm and dry.     Findings: No erythema or rash.  Neurological:     Mental Status: She is alert and oriented to person, place, and time.     ED Results / Procedures / Treatments   Labs (all labs ordered are listed, but only abnormal results are displayed) Labs Reviewed  CBC - Abnormal; Notable for the following components:      Result Value   WBC 11.0 (*)    All other components within normal limits  URINALYSIS, ROUTINE W REFLEX MICROSCOPIC - Abnormal; Notable for the following components:   Specific Gravity, Urine <1.005 (*)    Glucose, UA 100 (*)    All other components within normal limits  PREGNANCY, URINE  COMPREHENSIVE METABOLIC PANEL  LIPASE, BLOOD    EKG None  Radiology CT ABDOMEN PELVIS W CONTRAST  Result Date: 02/14/2021 CLINICAL DATA:  Nonlocalized abdominal pain EXAM: CT ABDOMEN  AND PELVIS WITH CONTRAST TECHNIQUE: Multidetector CT imaging of the abdomen and pelvis was performed using the standard protocol following bolus administration of intravenous contrast. CONTRAST:  135mL OMNIPAQUE IOHEXOL 300 MG/ML  SOLN COMPARISON:  None. FINDINGS: Lower chest: The visualized heart size within normal limits. No pericardial fluid/thickening. No hiatal hernia. The visualized portions of the lungs are clear. Hepatobiliary: The liver is normal in density without focal abnormality.The main portal vein is patent. The patient is status post cholecystectomy. No biliary ductal dilation. Pancreas: Unremarkable. No pancreatic ductal dilatation or surrounding inflammatory changes. Spleen: Normal in size without focal abnormality. Adrenals/Urinary Tract: Both adrenal glands appear normal. The kidneys  and collecting system appear normal without evidence of urinary tract calculus or hydronephrosis. Bladder is unremarkable. Stomach/Bowel: The stomach and small bowel is unremarkable. There is a partial right-sided colectomy. The remainder of the colon is unremarkable without wall thickening or inflammatory changes. Vascular/Lymphatic: There are no enlarged mesenteric, retroperitoneal, or pelvic lymph nodes. Scattered aortic atherosclerotic calcifications are seen without aneurysmal dilatation. Reproductive: The patient is status post hysterectomy. No adnexal masses or collections seen. Other: No evidence of abdominal wall mass or hernia. Musculoskeletal: No acute or significant osseous findings. IMPRESSION: No acute intra-abdominal or pelvic pathology to explain the patient's symptoms Aortic Atherosclerosis (ICD10-I70.0). Electronically Signed   By: Prudencio Pair M.D.   On: 02/14/2021 19:57    Procedures Procedures   Medications Ordered in ED Medications  fentaNYL (SUBLIMAZE) injection 100 mcg (100 mcg Intravenous Given 02/14/21 1830)  ondansetron (ZOFRAN) injection 4 mg (4 mg Intravenous Given 02/14/21 1830)   sodium chloride 0.9 % bolus 1,000 mL (0 mLs Intravenous Stopped 02/14/21 2029)  iohexol (OMNIPAQUE) 300 MG/ML solution 100 mL (100 mLs Intravenous Contrast Given 02/14/21 1933)    ED Course  I have reviewed the triage vital signs and the nursing notes.  Pertinent labs & imaging results that were available during my care of the patient were reviewed by me and considered in my medical decision making (see chart for details).    MDM Rules/Calculators/A&P                          60yo female with history of COPD, depression, GERD, hyperlipidemia, hysterectomy/cholecystectomy, bowel surgery, admission for SBO 10/2020 who presents with concern for abdominal pain.  DDx includes appendicitis, pancreatitis, pyelonephritis, nephrolithiasis, diverticulitis, SBO, constipation.  Given history and exam with concern for possible SBO, CT completed which shows no evidence of SBO or other abnormalities.  Labs without signs of pancreatitis, UTI or other abnormalities.  Suspect pain secondary to constipation. Discussed increased constipation regimen. Patient discharged in stable condition with understanding of reasons to return.      Final Clinical Impression(s) / ED Diagnoses Final diagnoses:  Constipation, unspecified constipation type  Left lower quadrant abdominal pain    Rx / DC Orders ED Discharge Orders    None       Gareth Morgan, MD 02/15/21 1213

## 2021-02-14 NOTE — ED Notes (Signed)
Pt has call bell and advised of the ED process Declined blanket at this time

## 2021-05-26 ENCOUNTER — Emergency Department (HOSPITAL_BASED_OUTPATIENT_CLINIC_OR_DEPARTMENT_OTHER): Payer: Medicare HMO

## 2021-05-26 ENCOUNTER — Encounter (HOSPITAL_BASED_OUTPATIENT_CLINIC_OR_DEPARTMENT_OTHER): Payer: Self-pay | Admitting: Emergency Medicine

## 2021-05-26 ENCOUNTER — Other Ambulatory Visit: Payer: Self-pay

## 2021-05-26 ENCOUNTER — Emergency Department (HOSPITAL_BASED_OUTPATIENT_CLINIC_OR_DEPARTMENT_OTHER)
Admission: EM | Admit: 2021-05-26 | Discharge: 2021-05-26 | Disposition: A | Discharge status: Home / Self Care | Payer: Medicare HMO | Attending: Emergency Medicine | Admitting: Emergency Medicine

## 2021-05-26 DIAGNOSIS — K219 Gastro-esophageal reflux disease without esophagitis: Secondary | ICD-10-CM | POA: Diagnosis not present

## 2021-05-26 DIAGNOSIS — J449 Chronic obstructive pulmonary disease, unspecified: Secondary | ICD-10-CM | POA: Diagnosis not present

## 2021-05-26 DIAGNOSIS — F17210 Nicotine dependence, cigarettes, uncomplicated: Secondary | ICD-10-CM | POA: Diagnosis not present

## 2021-05-26 DIAGNOSIS — I10 Essential (primary) hypertension: Secondary | ICD-10-CM | POA: Insufficient documentation

## 2021-05-26 DIAGNOSIS — Z79899 Other long term (current) drug therapy: Secondary | ICD-10-CM | POA: Insufficient documentation

## 2021-05-26 DIAGNOSIS — Z859 Personal history of malignant neoplasm, unspecified: Secondary | ICD-10-CM | POA: Insufficient documentation

## 2021-05-26 DIAGNOSIS — K5900 Constipation, unspecified: Secondary | ICD-10-CM | POA: Diagnosis not present

## 2021-05-26 DIAGNOSIS — R109 Unspecified abdominal pain: Secondary | ICD-10-CM | POA: Diagnosis present

## 2021-05-26 LAB — COMPREHENSIVE METABOLIC PANEL
ALT: 17 U/L (ref 0–44)
AST: 17 U/L (ref 15–41)
Albumin: 3.9 g/dL (ref 3.5–5.0)
Alkaline Phosphatase: 81 U/L (ref 38–126)
Anion gap: 7 (ref 5–15)
BUN: 11 mg/dL (ref 6–20)
CO2: 25 mmol/L (ref 22–32)
Calcium: 8.5 mg/dL — ABNORMAL LOW (ref 8.9–10.3)
Chloride: 106 mmol/L (ref 98–111)
Creatinine, Ser: 0.8 mg/dL (ref 0.44–1.00)
GFR, Estimated: >60 mL/min (ref >60)
Glucose, Bld: 95 mg/dL (ref 70–99)
Potassium: 4 mmol/L (ref 3.5–5.1)
Sodium: 138 mmol/L (ref 135–145)
Total Bilirubin: 0.2 mg/dL — ABNORMAL LOW (ref 0.3–1.2)
Total Protein: 7.1 g/dL (ref 6.5–8.1)

## 2021-05-26 LAB — CBC WITH DIFFERENTIAL/PLATELET
Abs Immature Granulocytes: 0.01 10*3/uL (ref 0.00–0.07)
Basophils Absolute: 0.1 10*3/uL (ref 0.0–0.1)
Basophils Relative: 1 %
Eosinophils Absolute: 0.3 10*3/uL (ref 0.0–0.5)
Eosinophils Relative: 3 %
HCT: 39.6 % (ref 36.0–46.0)
Hemoglobin: 13.7 g/dL (ref 12.0–15.0)
Immature Granulocytes: 0 %
Lymphocytes Relative: 50 %
Lymphs Abs: 4.4 10*3/uL — ABNORMAL HIGH (ref 0.7–4.0)
MCH: 31.6 pg (ref 26.0–34.0)
MCHC: 34.6 g/dL (ref 30.0–36.0)
MCV: 91.2 fL (ref 80.0–100.0)
Monocytes Absolute: 0.6 10*3/uL (ref 0.1–1.0)
Monocytes Relative: 7 %
Neutro Abs: 3.5 10*3/uL (ref 1.7–7.7)
Neutrophils Relative %: 39 %
Platelets: 316 10*3/uL (ref 150–400)
RBC: 4.34 MIL/uL (ref 3.87–5.11)
RDW: 11.9 % (ref 11.5–15.5)
WBC: 8.8 10*3/uL (ref 4.0–10.5)
nRBC: 0 % (ref 0.0–0.2)

## 2021-05-26 LAB — LIPASE, BLOOD: Lipase: 36 U/L (ref 11–51)

## 2021-05-26 MED ORDER — SODIUM CHLORIDE 0.9 % IV BOLUS
500.0000 mL | Freq: Once | INTRAVENOUS | Status: AC
  Administered 2021-05-26: 500 mL via INTRAVENOUS

## 2021-05-26 MED ORDER — IOHEXOL 300 MG/ML  SOLN
100.0000 mL | Freq: Once | INTRAMUSCULAR | Status: AC | PRN
  Administered 2021-05-26: 100 mL via INTRAVENOUS

## 2021-05-26 MED ORDER — ONDANSETRON HCL 4 MG/2ML IJ SOLN
4.0000 mg | Freq: Once | INTRAMUSCULAR | Status: AC
  Administered 2021-05-26: 4 mg via INTRAVENOUS
  Filled 2021-05-26: qty 2

## 2021-05-26 MED ORDER — DICYCLOMINE HCL 10 MG/ML IM SOLN
20.0000 mg | Freq: Once | INTRAMUSCULAR | Status: AC
  Administered 2021-05-26: 20 mg via INTRAMUSCULAR
  Filled 2021-05-26: qty 2

## 2021-05-26 NOTE — Discharge Instructions (Addendum)
Use 8 capfuls of MiraLAX in a 32 ounce Gatorade and drink this over 3 to 4 hours, then continue using 1 capful of MiraLAX daily.  Follow-up with your primary care doctor if you are still having difficulty having regular bowel movements.

## 2021-05-26 NOTE — ED Triage Notes (Signed)
Pt reports no BM x 7d; hx of SBO

## 2021-05-26 NOTE — ED Provider Notes (Signed)
[facility] EMERGENCY DEPARTMENT Provider Note   CSN: [id number] Arrival date & time: 05/26/21  1421     History Chief Complaint  Patient presents with   Constipation    Erin Prince is a 60 y.o. female.  Erin Prince is a 60 y.o. female with a history of hypertension, hyperlipidemia, COPD, GERD, cancer, small bowel obstruction, who presents to the emergency department for evaluation of constipation and abdominal discomfort.  Patient reports she has not been able to have a bowel movement in about a week and now her abdomen is starting to feel distended with intermittent pain and discomfort.  She has tried using over-the-counter medications and suppositories but has not been able to pass any stool.  Reports because of this discomfort she has had very little p.o. intake over the past few days.  No nausea or vomiting.  Patient has been able to pass gas.  She is concerned she may be developing another bowel obstruction.  Has had previous hysterectomy and small bowel resection.  The history is provided by the patient and the spouse.      Past Medical History:  Diagnosis Date   Cancer ([place])    COPD (chronic obstructive pulmonary disease) ([place]) 01/05/2017   Depression with anxiety 01/05/2017   GERD (gastroesophageal reflux disease) 01/05/2017   Hyperlipidemia 01/05/2017   Hypertension    Pancreatitis    Tobacco use 10/21/2020    Patient Active Problem List   Diagnosis Date Noted   Aortic atherosclerosis ([place]) 10/22/2020   Abdominal pain 10/22/2020   Constipation 10/22/2020   Nausea 10/22/2020   Partial small bowel obstruction ([place]) 10/21/2020   Tobacco use 10/21/2020   Dilation of biliary tract    Elevated lipase    Elevated liver enzymes    Pancreatitis, acute 01/05/2017   Hypokalemia 01/05/2017   Essential hypertension 01/05/2017   Depression with anxiety 01/05/2017   GERD (gastroesophageal reflux disease) 01/05/2017   COPD (chronic obstructive pulmonary  disease) ([place]) 01/05/2017   Hyperlipidemia 01/05/2017   Acute pancreatitis 01/05/2017    Past Surgical History:  Procedure Laterality Date   ABDOMINAL HYSTERECTOMY     ABDOMINAL SURGERY     CHOLECYSTECTOMY     SMALL INTESTINE SURGERY       OB History   No obstetric history on file.     Family History  Problem Relation Age of Onset   Diabetes Mellitus II Mother    Lung cancer Father    Kidney disease Sister    Pancreatic disease Neg Hx     Social History   Tobacco Use   Smoking status: Every Day    Types: Cigarettes   Smokeless tobacco: Never  Substance Use Topics   Alcohol use: No   Drug use: Yes    Types: Marijuana    Home Medications Prior to Admission medications   Medication Sig Start Date End Date Taking? Authorizing Provider  amitriptyline (ELAVIL) 50 MG tablet Take 50 mg by mouth at bedtime.    [provider]  artificial tears (LACRILUBE) OINT ophthalmic ointment Place into the left eye every 4 (four) hours as needed for dry eyes. 10/23/20   Desiree Hane, MD  cyclobenzaprine (FLEXERIL) 10 MG tablet Take 10 mg by mouth at bedtime. 10/13/20   [provider]  estradiol (ESTRACE) 0.5 MG tablet Take 0.5 mg by mouth daily.    [provider]  gabapentin (NEURONTIN) 300 MG capsule Take 300 mg by mouth 3 (three) times daily.  [provider]  lisinopril (PRINIVIL,ZESTRIL) 20 MG tablet Take 20 mg by mouth daily. 11/29/16   [provider]  nicotine (NICODERM CQ - DOSED IN MG/24 HOURS) 21 mg/24hr patch Place 1 patch (21 mg total) onto the skin daily. 10/24/20   Desiree Hane, MD  ondansetron (ZOFRAN-ODT) 4 MG disintegrating tablet Take 4 mg by mouth every 6 (six) hours as needed for nausea/vomiting. 10/12/20   [provider]  Oxycodone HCl 10 MG TABS Take 10 mg by mouth every 8 (eight) hours as needed (pain). 10/16/20   [provider]  pantoprazole (PROTONIX) 40 MG tablet Take 40 mg by mouth daily.  11/29/16   [provider]    Allergies    Bactrim [sulfamethoxazole-trimethoprim], Nsaids, and Tramadol  Review of Systems   Review of Systems  Constitutional:  Negative for chills and fever.  Respiratory:  Negative for shortness of breath.   Cardiovascular:  Negative for chest pain.  Gastrointestinal:  Positive for abdominal distention, abdominal pain and constipation. Negative for nausea and vomiting.  Genitourinary:  Negative for dysuria.  All other systems reviewed and are negative.  Physical Exam Updated Vital Signs BP 132/79   Pulse 80   Temp 98.5 F (36.9 C) (Oral)   Resp 18   Ht 5\' 2"  (1.575 m)   Wt 78.5 kg   LMP 11/24/2011   SpO2 97%   BMI 31.64 kg/m   Physical Exam Vitals and nursing note reviewed.  Constitutional:      General: She is not in acute distress.    Appearance: Normal appearance. She is well-developed. She is not diaphoretic.     Comments: Chronically ill-appearing but in no acute distress  HENT:     Head: Normocephalic and atraumatic.  Eyes:     General:        Right eye: No discharge.        Left eye: No discharge.     Pupils: Pupils are equal, round, and reactive to light.  Cardiovascular:     Rate and Rhythm: Normal rate and regular rhythm.     Pulses: Normal pulses.     Heart sounds: Normal heart sounds.  Pulmonary:     Effort: Pulmonary effort is normal. No respiratory distress.     Breath sounds: Normal breath sounds. No wheezing or rales.     Comments: Respirations equal and unlabored, patient able to speak in full sentences, lungs clear to auscultation bilaterally  Abdominal:     General: Bowel sounds are normal. There is no distension.     Palpations: Abdomen is soft. There is no mass.     Tenderness: There is abdominal tenderness. There is no guarding.     Comments: Abdomen is soft without notable distention, bowel sounds present throughout, there is very mild generalized tenderness that does not localize to 1 area, no  guarding or peritoneal signs  Musculoskeletal:        General: No deformity.     Cervical back: Neck supple.  Skin:    General: Skin is warm and dry.     Capillary Refill: Capillary refill takes less than 2 seconds.  Neurological:     Mental Status: She is alert and oriented to person, place, and time.     Coordination: Coordination normal.     Comments: Speech is clear, able to follow commands Moves extremities without ataxia, coordination intact  Psychiatric:        Mood and Affect: Mood normal.  Behavior: Behavior normal.    ED Results / Procedures / Treatments   Labs (all labs ordered are listed, but only abnormal results are displayed) Labs Reviewed  COMPREHENSIVE METABOLIC PANEL - Abnormal; Notable for the following components:      Result Value   Calcium 8.5 (*)    Total Bilirubin 0.2 (*)    All other components within normal limits  CBC WITH DIFFERENTIAL/PLATELET - Abnormal; Notable for the following components:   Lymphs Abs 4.4 (*)    All other components within normal limits  LIPASE, BLOOD    EKG None  Radiology CT Abdomen Pelvis W Contrast  Result Date: 05/26/2021 CLINICAL DATA:  Abdominal pain. No bowel movement for the past 7 days. EXAM: CT ABDOMEN AND PELVIS WITH CONTRAST TECHNIQUE: Multidetector CT imaging of the abdomen and pelvis was performed using the standard protocol following bolus administration of intravenous contrast. CONTRAST:  145mL OMNIPAQUE IOHEXOL 300 MG/ML  SOLN COMPARISON:  CT abdomen pelvis dated February 14, 2021. FINDINGS: Lower chest: No acute abnormality. Hepatobiliary: No focal liver abnormality is seen. Status post cholecystectomy. No biliary dilatation. Pancreas: Unremarkable. No pancreatic ductal dilatation or surrounding inflammatory changes. Spleen: Normal in size without focal abnormality. Adrenals/Urinary Tract: Adrenal glands are unremarkable. Kidneys are normal, without renal calculi, focal lesion, or hydronephrosis. Bladder is  unremarkable. Stomach/Bowel: Stomach is within normal limits. Appendix appears normal. No evidence of bowel wall thickening, distention, or inflammatory changes. Prior partial small bowel resection in the right lower quadrant. Vascular/Lymphatic: Aortic atherosclerosis. No enlarged abdominal or pelvic lymph nodes. Reproductive: Status post hysterectomy. No adnexal masses. Other: No abdominal wall hernia or abnormality. No abdominopelvic ascites. No pneumoperitoneum. Musculoskeletal: No acute or significant osseous findings. IMPRESSION: 1. No acute intra-abdominal process. 2. Aortic Atherosclerosis (ICD10-I70.0). Electronically Signed   By: Titus Dubin M.D.   On: 05/26/2021 17:19    Procedures Procedures   Medications Ordered in ED Medications  sodium chloride 0.9 % bolus 500 mL (0 mLs Intravenous Stopped 05/26/21 1650)  ondansetron (ZOFRAN) injection 4 mg (4 mg Intravenous Given 05/26/21 1547)  dicyclomine (BENTYL) injection 20 mg (20 mg Intramuscular Given 05/26/21 1628)  iohexol (OMNIPAQUE) 300 MG/ML solution 100 mL (100 mLs Intravenous Contrast Given 05/26/21 1657)    ED Course  I have reviewed the triage vital signs and the nursing notes.  Pertinent labs & imaging results that were available during my care of the patient were reviewed by me and considered in my medical decision making (see chart for details).    MDM Rules/Calculators/A&P                          Patient presents to the ED with complaints of abdominal pain and constipation. Patient nontoxic appearing, in no apparent distress, vitals WNL . On exam patient with very mild generalized tenderness, bowel sounds present, no peritoneal signs. Will evaluate with labs and ct abdomen pelvis. Analgesics, anti-emetics, and fluids administered.   Additional history obtained:  Additional history obtained from chart review & nursing note review.   Lab Tests:  I Ordered, reviewed, and interpreted labs, which included:  CBC: No  leukocytosis, normal hemoglobin CMP: No significant electrolyte derangements, normal renal and liver function Lipase: WNL  Imaging Studies ordered:  I ordered imaging studies which included CT abdomen pelvis, I independently reviewed, formal radiology impression shows:  No evidence of bowel obstruction and no other acute intra-abdominal process, imaging reviewed, do not see significant large stool burden, no signs of  impaction.   ED Course:    On repeat abdominal exam patient remains without peritoneal signs, low suspicion for cholecystitis, pancreatitis, diverticulitis, appendicitis, bowel obstruction/perforation, or other acute surgical process. Patient tolerating PO in the emergency department. Will discharge home with supportive measures.  We will have patient do MiraLAX cleanout and then continue MiraLAX and fiber supplementation daily.  PCP follow-up encouraged.  I discussed results, treatment plan, need for PCP follow-up, and return precautions with the patient. Provided opportunity for questions, patient confirmed understanding and is in agreement with plan.    Portions of this note were generated with [employer]. Dictation errors may occur despite best attempts at proofreading.    Final Clinical Impression(s) / ED Diagnoses Final diagnoses:  Constipation, unspecified constipation type    Rx / DC Orders ED Discharge Orders     None        Janet Berlin 05/27/21 1305    Drenda Freeze, MD 05/27/21 1454

## 2021-12-17 ENCOUNTER — Encounter: Payer: Self-pay | Admitting: Plastic Surgery

## 2021-12-17 ENCOUNTER — Other Ambulatory Visit: Payer: Self-pay

## 2021-12-17 ENCOUNTER — Ambulatory Visit: Payer: Medicare HMO | Admitting: Plastic Surgery

## 2021-12-17 ENCOUNTER — Institutional Professional Consult (permissible substitution): Payer: Medicare HMO | Admitting: Plastic Surgery

## 2021-12-17 VITALS — BP 134/86 | HR 93 | Ht 64.0 in | Wt 170.4 lb

## 2021-12-17 DIAGNOSIS — D489 Neoplasm of uncertain behavior, unspecified: Secondary | ICD-10-CM

## 2021-12-17 NOTE — Progress Notes (Signed)
° °  Referring Provider Harvie Junior, MD [place],  [place] [address]   CC:  Right forehead fatty mass   Erin Prince is an 61 y.o. female.  HPI: Patient is a 61 year old with a right forehead fatty mass.  This has been present for about 1 year.  It is painful.  It has gotten a little bit larger.  Has never been biopsied before and does not bleed or drain.  Allergies  Allergen Reactions   Bactrim [Sulfamethoxazole-Trimethoprim] Anaphylaxis    Anaphylaxis     Nsaids     Other reaction(s): Other (See Comments)   Tramadol     Other reaction(s): Other (See Comments)    Outpatient Encounter Medications as of 12/17/2021  Medication Sig   amitriptyline (ELAVIL) 50 MG tablet Take 50 mg by mouth at bedtime.   artificial tears (LACRILUBE) OINT ophthalmic ointment Place into the left eye every 4 (four) hours as needed for dry eyes.   cyclobenzaprine (FLEXERIL) 10 MG tablet Take 10 mg by mouth at bedtime.   estradiol (ESTRACE) 0.5 MG tablet Take 0.5 mg by mouth daily.   gabapentin (NEURONTIN) 300 MG capsule Take 300 mg by mouth 3 (three) times daily.   lisinopril (PRINIVIL,ZESTRIL) 20 MG tablet Take 20 mg by mouth daily.   nicotine (NICODERM CQ - DOSED IN MG/24 HOURS) 21 mg/24hr patch Place 1 patch (21 mg total) onto the skin daily.   ondansetron (ZOFRAN-ODT) 4 MG disintegrating tablet Take 4 mg by mouth every 6 (six) hours as needed for nausea/vomiting.   Oxycodone HCl 10 MG TABS Take 10 mg by mouth every 8 (eight) hours as needed (pain).   pantoprazole (PROTONIX) 40 MG tablet Take 40 mg by mouth daily.   No facility-administered encounter medications on file as of 12/17/2021.     Past Medical History:  Diagnosis Date   Cancer ([place])    COPD (chronic obstructive pulmonary disease) ([place]) 01/05/2017   Depression with anxiety 01/05/2017   GERD (gastroesophageal reflux disease) 01/05/2017   Hyperlipidemia 01/05/2017   Hypertension    Pancreatitis    Tobacco use 10/21/2020     Past Surgical History:  Procedure Laterality Date   ABDOMINAL HYSTERECTOMY     ABDOMINAL SURGERY     CHOLECYSTECTOMY     SMALL INTESTINE SURGERY      Family History  Problem Relation Age of Onset   Diabetes Mellitus II Mother    Lung cancer Father    Kidney disease Sister    Pancreatic disease Neg Hx     Social History   Social History Narrative   Not on file     Review of Systems General: Denies fevers, chills, weight loss CV: Denies chest pain, shortness of breath, palpitations   Physical Exam Vitals with BMI 12/17/2021 05/26/2021 05/26/2021  Height 5\' 4"  - -  Weight 170 lbs 6 oz - -  BMI 56.97 - -  Systolic 948 016 553  Diastolic 86 79 87  Pulse 93 80 81    General:  No acute distress,  Alert and oriented, Non-Toxic, Normal speech and affect HEENT: 1.5 x 1.5 cm right forehead mass fatty and mobile  Assessment/Plan Most likely lipoma but could also be sebaceous cyst.  We discussed options with patient which she would like to proceed with excision under local.  Erin Prince 12/17/2021, 1:46 PM

## 2021-12-18 IMAGING — CT CT ABD-PELV W/ CM
2 of 5 series · 16 of 46 positions shown, 18 images · IV contrast (Omnipaque)
Comparison: None.

CLINICAL DATA: Nonlocalized abdominal pain

EXAM:
CT ABDOMEN AND PELVIS WITH CONTRAST
TECHNIQUE: Multidetector CT imaging of the abdomen and pelvis was performed
using the standard protocol following bolus administration of
intravenous contrast.
CONTRAST:  100mL OMNIPAQUE IOHEXOL 300 MG/ML  SOLN

[Series 2: axial st · axial · 0.80mm/px · z∈[-436,-36]mm · 13 of 90 slices shown, 15 images]
[im 5/90  soft-tissue]
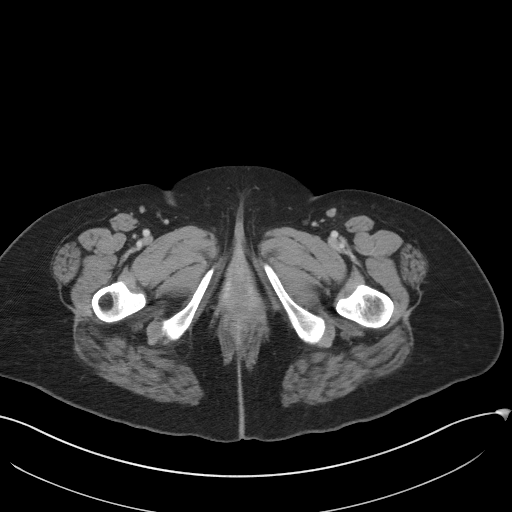
[im 5/90  bone]
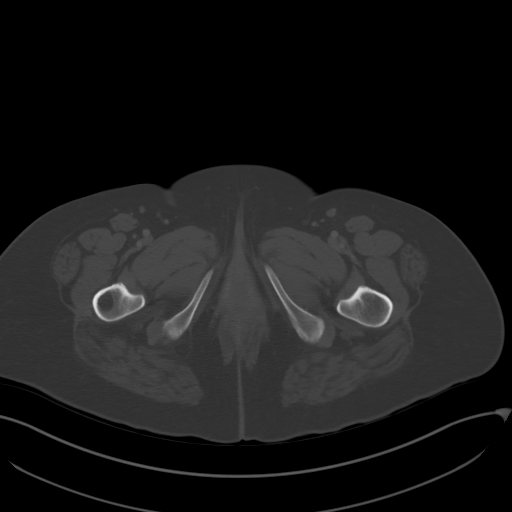
[im 15/90  soft-tissue]
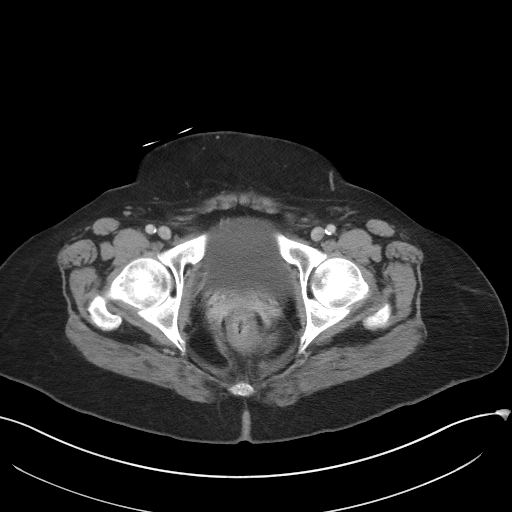
[im 19/90  soft-tissue]
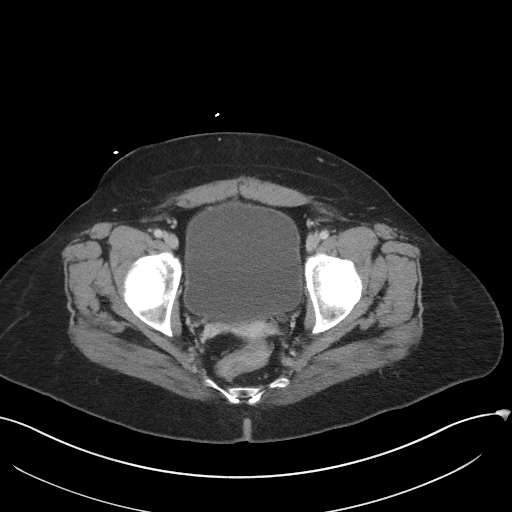
[im 24/90  soft-tissue]
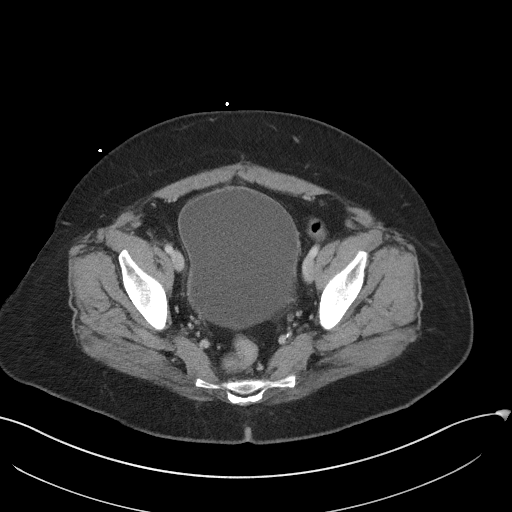
[im 33/90  soft-tissue]
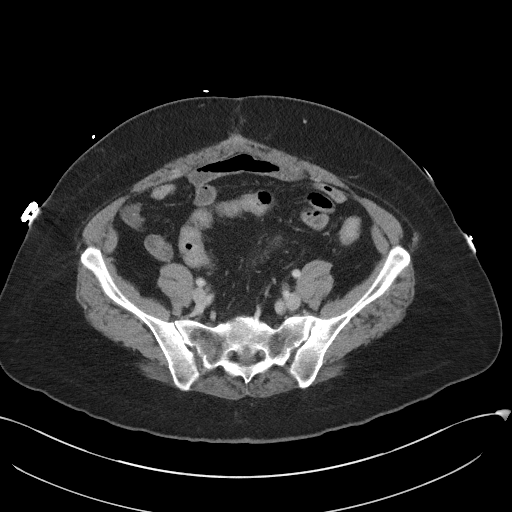
[im 38/90  soft-tissue]
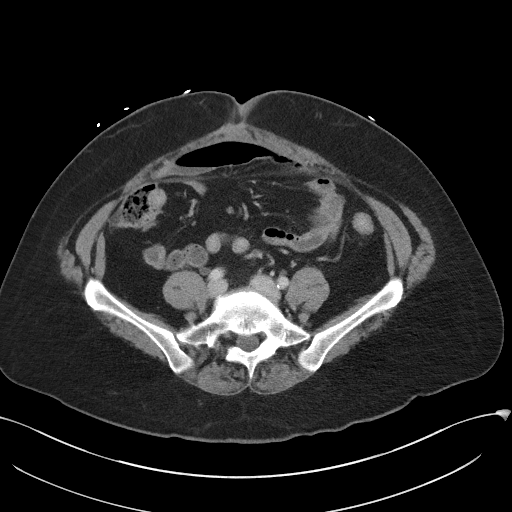
[im 47/90  soft-tissue]
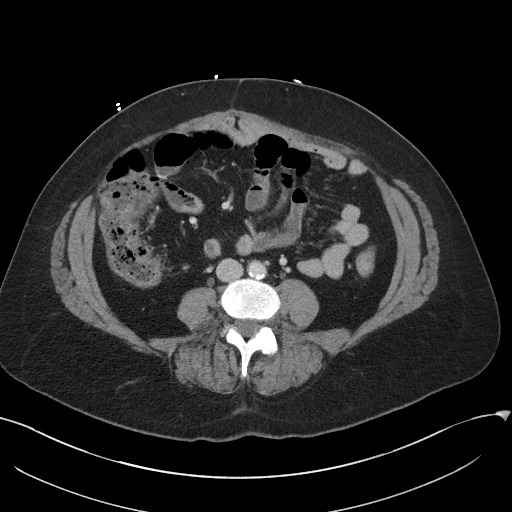
[im 52/90  soft-tissue]
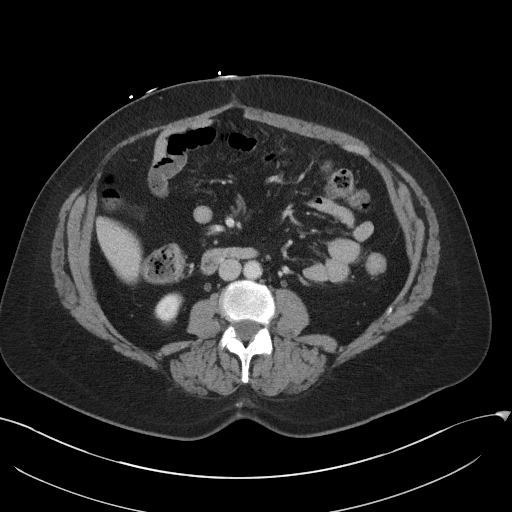
[im 57/90  soft-tissue]
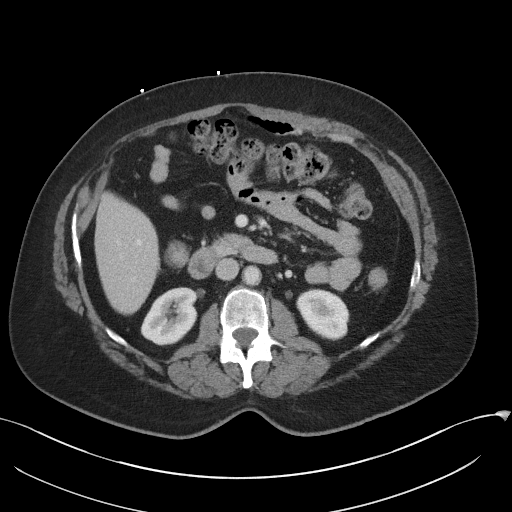
[im 57/90  bone]
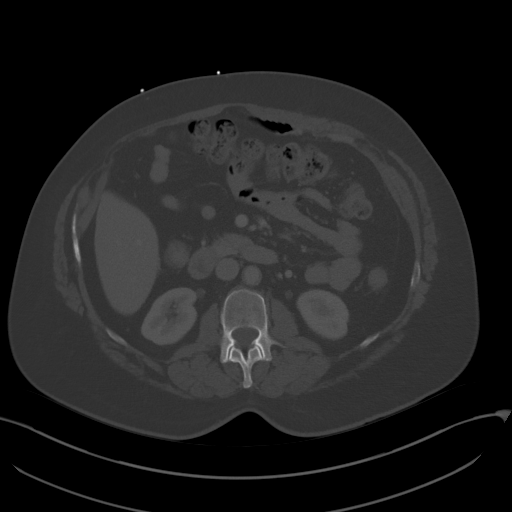
[im 66/90  soft-tissue]
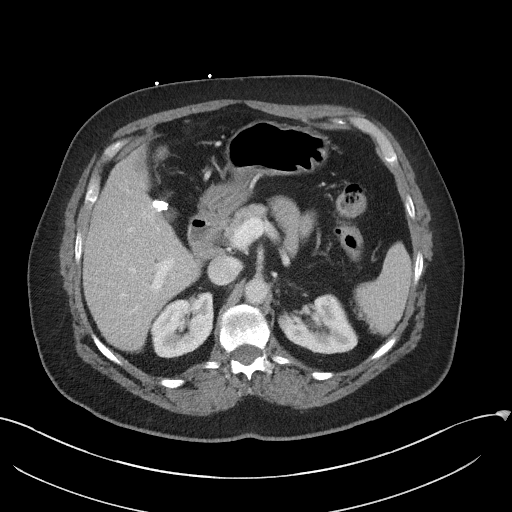
[im 71/90  soft-tissue]
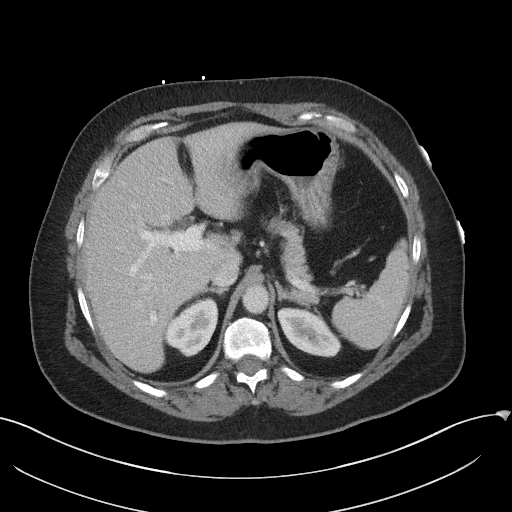
[im 75/90  soft-tissue]
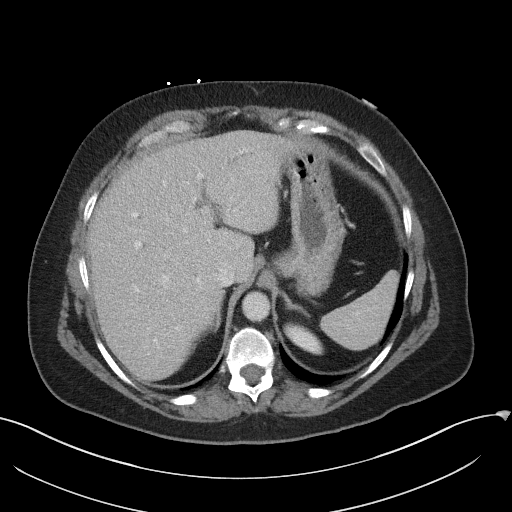
[im 85/90  soft-tissue]
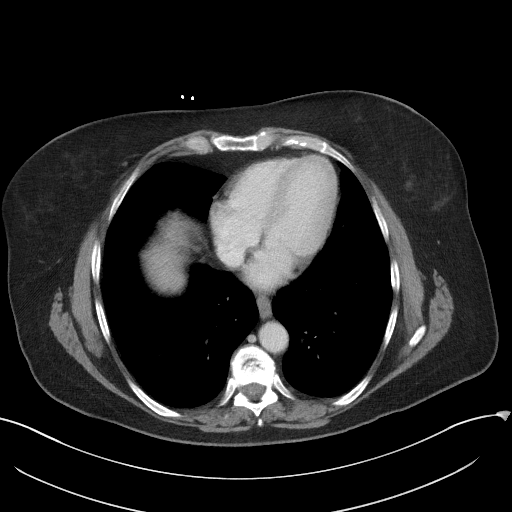

[Series 5: coronal st · coronal · 0.73mm/px · 3 of 104 slices shown]
[im 35/104  soft-tissue]
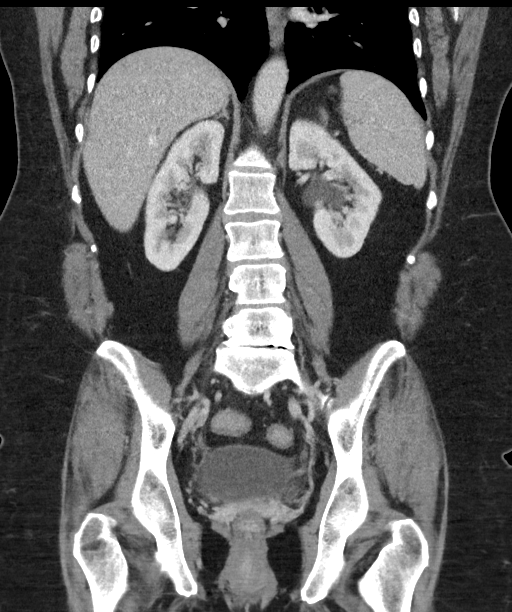
[im 46/104  soft-tissue]
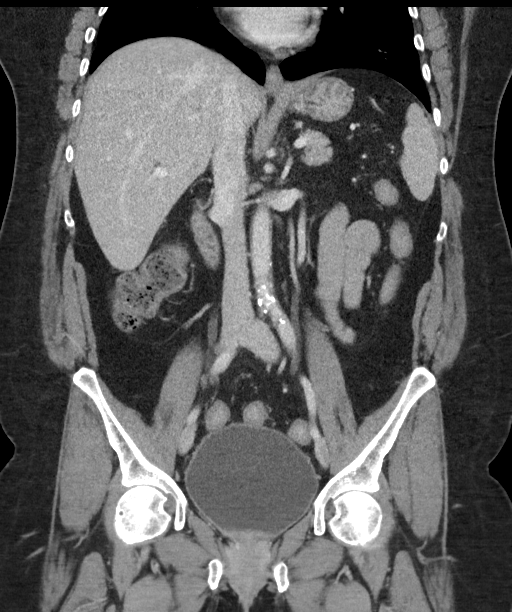
[im 58/104  soft-tissue]
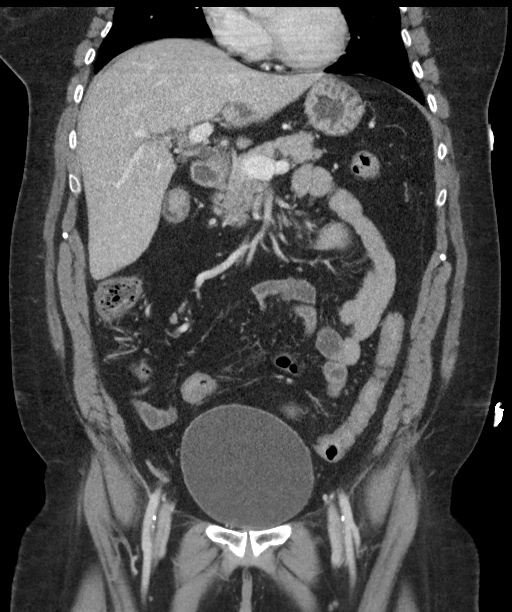

[16 of 46 positions shown; findings below may reference images not displayed]

FINDINGS: Lower chest: The visualized heart size within normal limits. No
pericardial fluid/thickening.

No hiatal hernia.

The visualized portions of the lungs are clear.

Hepatobiliary: The liver is normal in density without focal
abnormality.The main portal vein is patent. The patient is status
post cholecystectomy. No biliary ductal dilation.

Pancreas: Unremarkable. No pancreatic ductal dilatation or
surrounding inflammatory changes.

Spleen: Normal in size without focal abnormality.

Adrenals/Urinary Tract: Both adrenal glands appear normal. The
kidneys and collecting system appear normal without evidence of
urinary tract calculus or hydronephrosis. Bladder is unremarkable.

Stomach/Bowel: The stomach and small bowel is unremarkable. There is
a partial right-sided colectomy. The remainder of the colon is
unremarkable without wall thickening or inflammatory changes.

Vascular/Lymphatic: There are no enlarged mesenteric,
retroperitoneal, or pelvic lymph nodes. Scattered aortic
atherosclerotic calcifications are seen without aneurysmal
dilatation.

Reproductive: The patient is status post hysterectomy. No adnexal
masses or collections seen.

Other: No evidence of abdominal wall mass or hernia.

Musculoskeletal: No acute or significant osseous findings.
IMPRESSION: No acute intra-abdominal or pelvic pathology to explain the
patient's symptoms

Aortic Atherosclerosis (TCJKH-F01.1).

## 2021-12-24 ENCOUNTER — Other Ambulatory Visit: Payer: Self-pay

## 2021-12-24 ENCOUNTER — Other Ambulatory Visit (HOSPITAL_COMMUNITY)
Admission: RE | Admit: 2021-12-24 | Discharge: 2021-12-24 | Disposition: A | Discharge status: Home / Self Care | Payer: Medicare HMO | Source: Ambulatory Visit | Attending: Plastic Surgery | Admitting: Plastic Surgery

## 2021-12-24 ENCOUNTER — Ambulatory Visit: Payer: Medicare HMO | Admitting: Plastic Surgery

## 2021-12-24 VITALS — BP 119/86 | HR 96 | Ht 64.0 in | Wt 166.6 lb

## 2021-12-24 DIAGNOSIS — D179 Benign lipomatous neoplasm, unspecified: Secondary | ICD-10-CM | POA: Diagnosis present

## 2021-12-24 NOTE — Progress Notes (Signed)
Operative Note   DATE OF OPERATION: 12/24/2021  LOCATION:    SURGICAL DEPARTMENT: Plastic Surgery  PREOPERATIVE DIAGNOSES: Subfascial fatty mass right forehead.  POSTOPERATIVE DIAGNOSES:  same  PROCEDURE:  Excision of 1.2 cm right forehead mass, subfascial 1.2 cm intermediate closure right forehead  SURGEON: Camran Keady P. Talmage Teaster, MD  ANESTHESIA:  Local  COMPLICATIONS: None.   INDICATIONS FOR PROCEDURE:  The patient, Erin Prince is a 61 y.o. female born on 27-Oct-1961, is here for treatment of right forehead deep tissue mass. MRN: [id number]  CONSENT:  Informed consent was obtained directly from the patient. Risks, benefits and alternatives were fully discussed. Specific risks including but not limited to bleeding, infection, hematoma, seroma, scarring, pain, infection, wound healing problems, and need for further surgery were all discussed. The patient did have an ample opportunity to have questions answered to satisfaction.   DESCRIPTION OF PROCEDURE:  Local anesthesia was administered. The patient's operative site was prepped and draped in a sterile fashion. A time out was performed and all information was confirmed to be correct.  The skin was incised with a 15 blade.  Sharp and blunt dissection was used to dissect the fatty mass which was under the fascia.  Hemostasis was obtained with Bovie electrocautery.  Circumferential undermining was performed and the skin was advanced and closed in layers with interrupted buried Monocryl sutures and Prolene for the skin.  The lesion excised measured 1.2 cm.  The patient tolerated the procedure well.  There were no complications.

## 2021-12-25 LAB — SURGICAL PATHOLOGY

## 2021-12-31 ENCOUNTER — Telehealth: Payer: Self-pay

## 2021-12-31 ENCOUNTER — Ambulatory Visit: Payer: Medicare HMO | Admitting: Plastic Surgery

## 2021-12-31 NOTE — Telephone Encounter (Signed)
Patient called to say she had a procedure done a couple of weeks ago and she saw on MyChart that they got the pathology back on the mass they removed from her forehead.  Patient said she can't get into MyChart and she is trying to find out what the test result is.  She asked that we please call her back today so she can pass the results along to her doctor.

## 2021-12-31 NOTE — Telephone Encounter (Signed)
LMTRC

## 2022-01-07 ENCOUNTER — Other Ambulatory Visit: Payer: Self-pay

## 2022-01-07 ENCOUNTER — Ambulatory Visit: Payer: Medicare HMO | Admitting: Plastic Surgery

## 2022-01-07 DIAGNOSIS — D179 Benign lipomatous neoplasm, unspecified: Secondary | ICD-10-CM

## 2022-01-07 NOTE — Progress Notes (Signed)
Status post excision of right forehead mass.  No complaint.  Happy with initial result  Physical exam Incision clean dry and intact  Pathology: Right forehead mass consistent with lipoma.  Assessment and plan Suture removal and we will see the patient back as needed.

## 2022-01-08 NOTE — Telephone Encounter (Signed)
Played phone tag with patient, however patient was seen in-office 01/07/22. Will close note.

## 2022-03-29 IMAGING — CT CT ABD-PELV W/ CM
2 of 5 series · 17 of 46 positions shown, 19 images · IV contrast (Omnipaque)
Comparison: CT abdomen pelvis dated February 14, 2021.

CLINICAL DATA: Abdominal pain. No bowel movement for the past 7
days.

EXAM:
CT ABDOMEN AND PELVIS WITH CONTRAST
TECHNIQUE: Multidetector CT imaging of the abdomen and pelvis was performed
using the standard protocol following bolus administration of
intravenous contrast.
CONTRAST:  100mL OMNIPAQUE IOHEXOL 300 MG/ML  SOLN

[Series 2: axial st · axial · 0.85mm/px · z∈[-442,-37]mm · 14 of 91 slices shown, 16 images]
[im 5/91  soft-tissue]
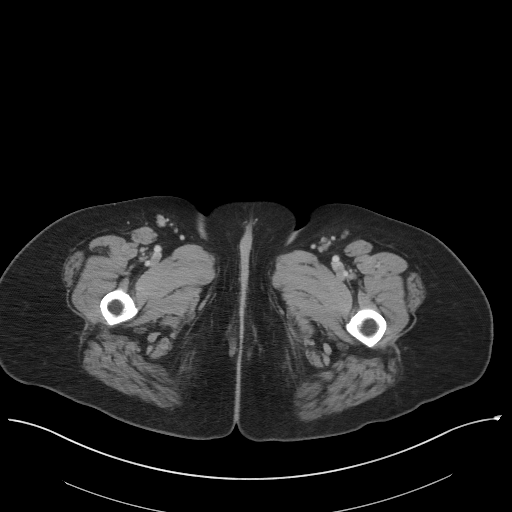
[im 5/91  bone]
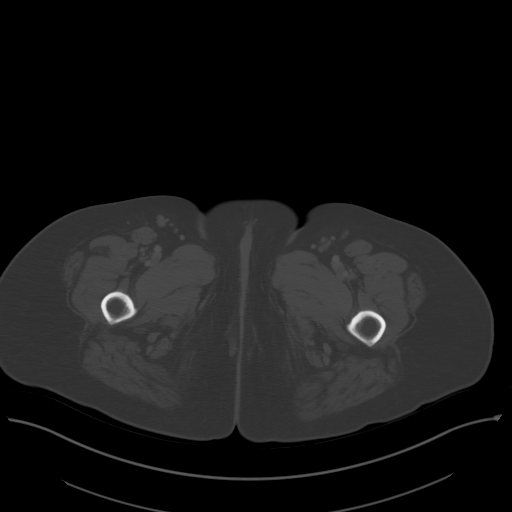
[im 14/91  soft-tissue]
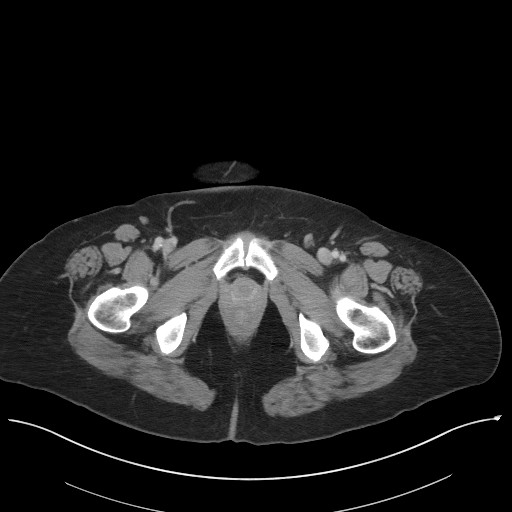
[im 19/91  soft-tissue]
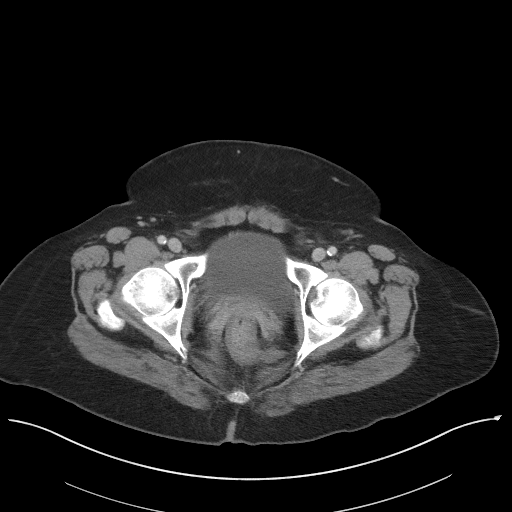
[im 23/91  soft-tissue]
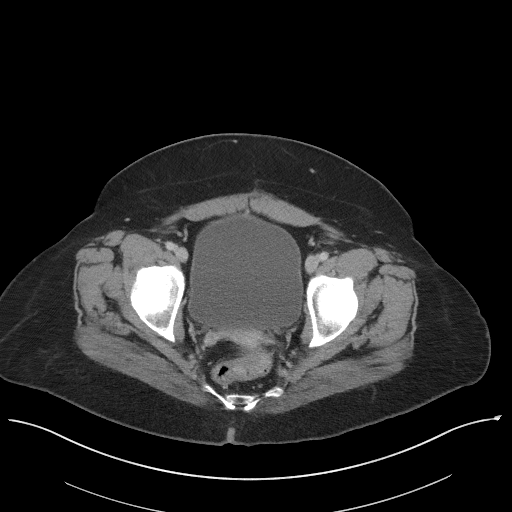
[im 32/91  soft-tissue]
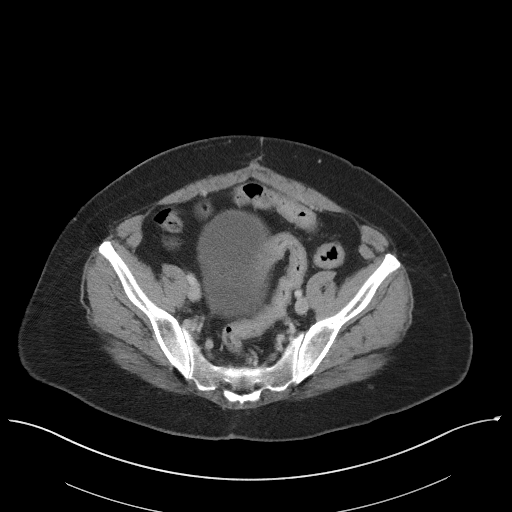
[im 37/91  soft-tissue]
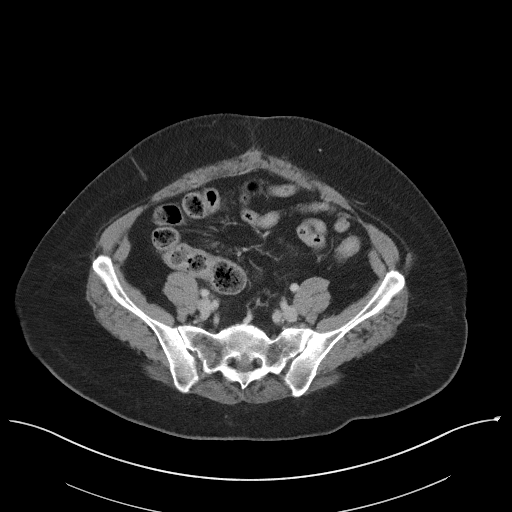
[im 41/91  soft-tissue]
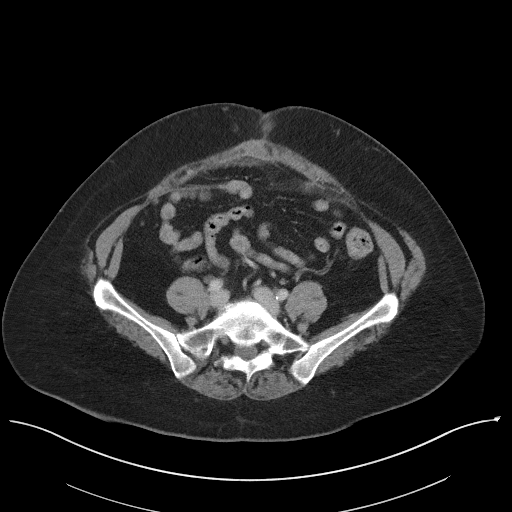
[im 50/91  soft-tissue]
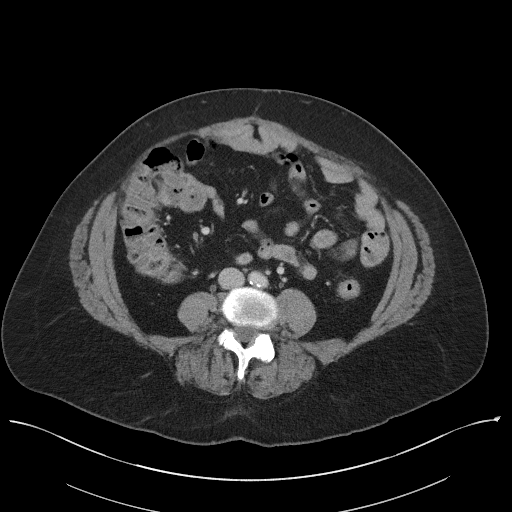
[im 55/91  soft-tissue]
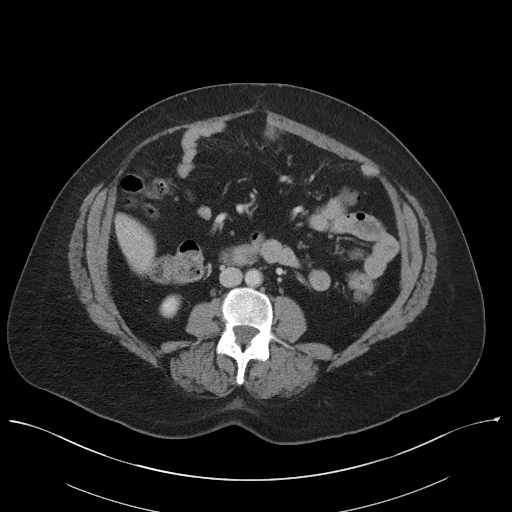
[im 55/91  bone]
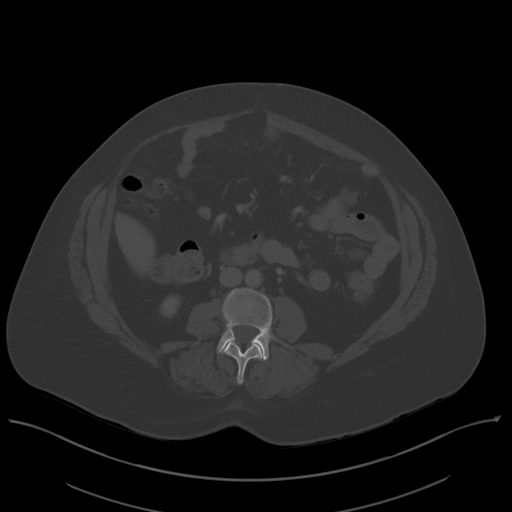
[im 59/91  soft-tissue]
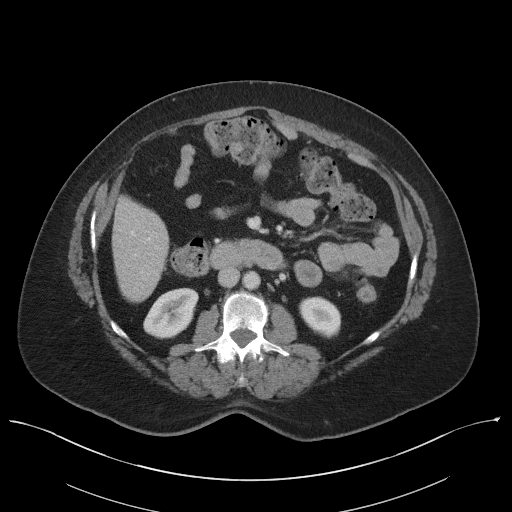
[im 68/91  soft-tissue]
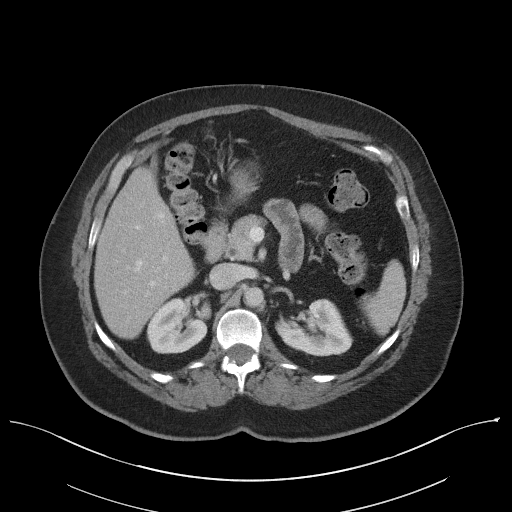
[im 73/91  soft-tissue]
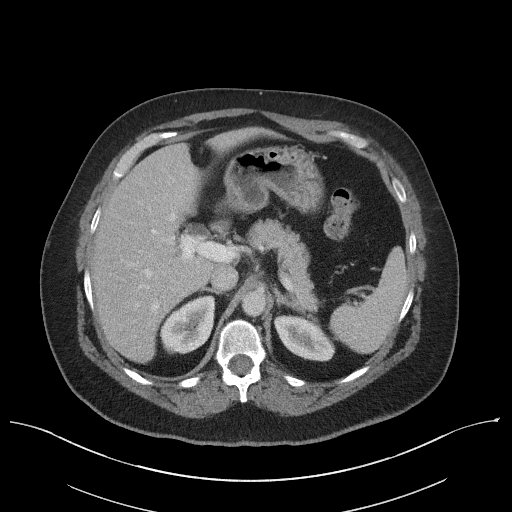
[im 77/91  soft-tissue]
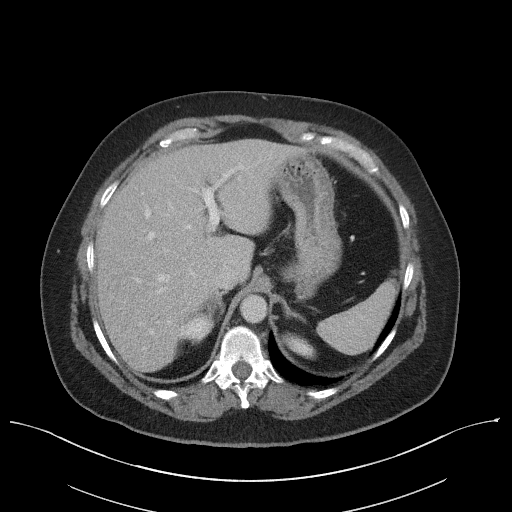
[im 86/91  soft-tissue]
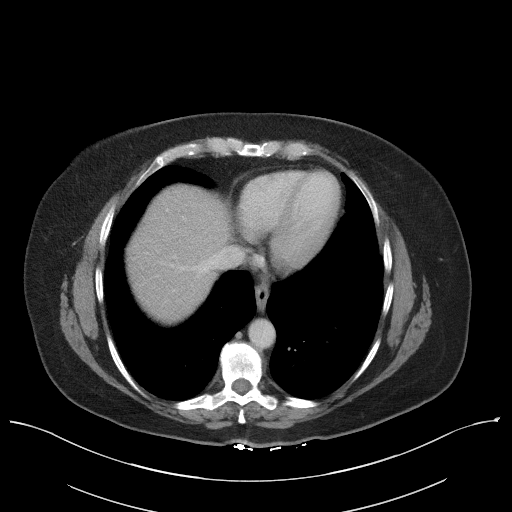

[Series 5: coronal st · coronal · 0.82mm/px · 3 of 104 slices shown]
[im 35/104  soft-tissue]
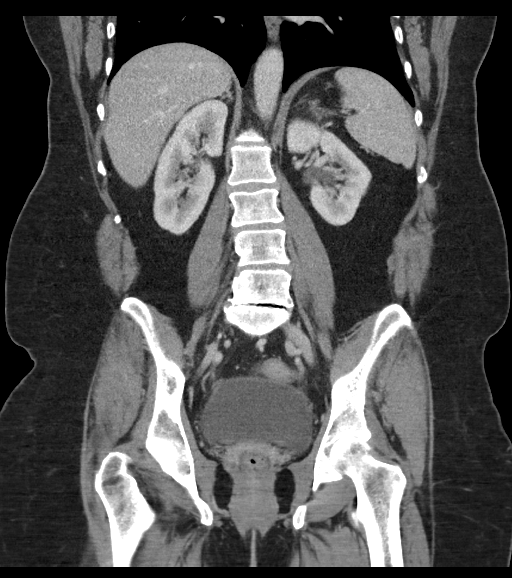
[im 46/104  soft-tissue]
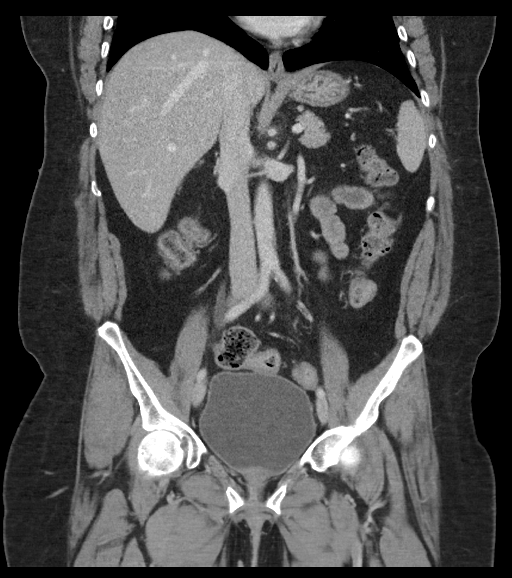
[im 58/104  soft-tissue]
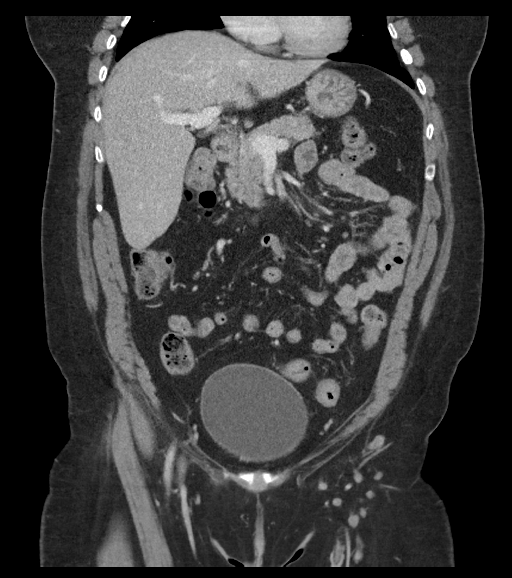

[17 of 46 positions shown; findings below may reference images not displayed]

FINDINGS: Lower chest: No acute abnormality.

Hepatobiliary: No focal liver abnormality is seen. Status post
cholecystectomy. No biliary dilatation.

Pancreas: Unremarkable. No pancreatic ductal dilatation or
surrounding inflammatory changes.

Spleen: Normal in size without focal abnormality.

Adrenals/Urinary Tract: Adrenal glands are unremarkable. Kidneys are
normal, without renal calculi, focal lesion, or hydronephrosis.
Bladder is unremarkable.

Stomach/Bowel: Stomach is within normal limits. Appendix appears
normal. No evidence of bowel wall thickening, distention, or
inflammatory changes. Prior partial small bowel resection in the
right lower quadrant.

Vascular/Lymphatic: Aortic atherosclerosis. No enlarged abdominal or
pelvic lymph nodes.

Reproductive: Status post hysterectomy. No adnexal masses.

Other: No abdominal wall hernia or abnormality. No abdominopelvic
ascites. No pneumoperitoneum.

Musculoskeletal: No acute or significant osseous findings.
IMPRESSION: 1. No acute intra-abdominal process.
2. Aortic Atherosclerosis ([id number]).

## 2023-01-11 ENCOUNTER — Emergency Department (HOSPITAL_BASED_OUTPATIENT_CLINIC_OR_DEPARTMENT_OTHER): Payer: Medicare HMO

## 2023-01-11 ENCOUNTER — Other Ambulatory Visit: Payer: Self-pay

## 2023-01-11 ENCOUNTER — Emergency Department (HOSPITAL_BASED_OUTPATIENT_CLINIC_OR_DEPARTMENT_OTHER)
Admission: EM | Admit: 2023-01-11 | Discharge: 2023-01-11 | Disposition: A | Discharge status: Home / Self Care | Payer: Medicare HMO | Attending: Emergency Medicine | Admitting: Emergency Medicine

## 2023-01-11 ENCOUNTER — Encounter (HOSPITAL_BASED_OUTPATIENT_CLINIC_OR_DEPARTMENT_OTHER): Payer: Self-pay | Admitting: Emergency Medicine

## 2023-01-11 DIAGNOSIS — M5432 Sciatica, left side: Secondary | ICD-10-CM | POA: Diagnosis not present

## 2023-01-11 DIAGNOSIS — M79605 Pain in left leg: Secondary | ICD-10-CM | POA: Insufficient documentation

## 2023-01-11 DIAGNOSIS — R202 Paresthesia of skin: Secondary | ICD-10-CM

## 2023-01-11 LAB — CBC WITH DIFFERENTIAL/PLATELET
Abs Immature Granulocytes: 0.02 10*3/uL (ref 0.00–0.07)
Basophils Absolute: 0.1 10*3/uL (ref 0.0–0.1)
Basophils Relative: 1 %
Eosinophils Absolute: 0.5 10*3/uL (ref 0.0–0.5)
Eosinophils Relative: 5 %
HCT: 39.4 % (ref 36.0–46.0)
Hemoglobin: 13.4 g/dL (ref 12.0–15.0)
Immature Granulocytes: 0 %
Lymphocytes Relative: 40 %
Lymphs Abs: 4.1 10*3/uL — ABNORMAL HIGH (ref 0.7–4.0)
MCH: 32.1 pg (ref 26.0–34.0)
MCHC: 34 g/dL (ref 30.0–36.0)
MCV: 94.5 fL (ref 80.0–100.0)
Monocytes Absolute: 0.8 10*3/uL (ref 0.1–1.0)
Monocytes Relative: 8 %
Neutro Abs: 4.9 10*3/uL (ref 1.7–7.7)
Neutrophils Relative %: 46 %
Platelets: 301 10*3/uL (ref 150–400)
RBC: 4.17 MIL/uL (ref 3.87–5.11)
RDW: 12 % (ref 11.5–15.5)
WBC: 10.4 10*3/uL (ref 4.0–10.5)
nRBC: 0 % (ref 0.0–0.2)

## 2023-01-11 LAB — COMPREHENSIVE METABOLIC PANEL
ALT: 44 U/L (ref 0–44)
AST: 34 U/L (ref 15–41)
Albumin: 3.9 g/dL (ref 3.5–5.0)
Alkaline Phosphatase: 84 U/L (ref 38–126)
Anion gap: 6 (ref 5–15)
BUN: 13 mg/dL (ref 6–20)
CO2: 28 mmol/L (ref 22–32)
Calcium: 8.4 mg/dL — ABNORMAL LOW (ref 8.9–10.3)
Chloride: 105 mmol/L (ref 98–111)
Creatinine, Ser: 0.85 mg/dL (ref 0.44–1.00)
GFR, Estimated: >60 mL/min (ref >60)
Glucose, Bld: 101 mg/dL — ABNORMAL HIGH (ref 70–99)
Potassium: 3.7 mmol/L (ref 3.5–5.1)
Sodium: 139 mmol/L (ref 135–145)
Total Bilirubin: 0.8 mg/dL (ref 0.3–1.2)
Total Protein: 7 g/dL (ref 6.5–8.1)

## 2023-01-11 LAB — PROTIME-INR
INR: 1 (ref 0.8–1.2)
Prothrombin Time: 12.9 seconds (ref 11.4–15.2)

## 2023-01-11 MED ORDER — MORPHINE SULFATE 15 MG PO TABS: 15.0000 mg | ORAL_TABLET | ORAL | Status: DC | PRN

## 2023-01-11 MED ORDER — OXYCODONE-ACETAMINOPHEN 5-325 MG PO TABS
1.0000 | ORAL_TABLET | Freq: Once | ORAL | Status: AC
  Administered 2023-01-11: 1 via ORAL
  Filled 2023-01-11: qty 1

## 2023-01-11 MED ORDER — OXYCODONE HCL 5 MG PO TABS
10.0000 mg | ORAL_TABLET | Freq: Once | ORAL | Status: AC
  Administered 2023-01-11: 10 mg via ORAL
  Filled 2023-01-11: qty 2

## 2023-01-11 NOTE — ED Notes (Signed)
D/c paperwork reviewed with pt, including follow up care.  No questions or concerns voiced at time of d/c. Marland Kitchen Pt verbalized understanding, Ambulatory with family to ED exit, NAD.

## 2023-01-11 NOTE — ED Notes (Signed)
ED Provider at bedside. 

## 2023-01-11 NOTE — ED Triage Notes (Signed)
Pt w/ LLE calf and foot pain, esp great toe; reports "goes to sleep" and cramping intermittently x 4 mos; had physical at home by insurance nurse practitioner and they recommended she be evaluated

## 2023-01-11 NOTE — Discharge Instructions (Addendum)
Please follow-up with the vascular surgeon for the paresthesias and the chronic pain.  We want to make sure that you do not have any blockages.  Follow-up with your primary care doctor in April as planned.

## 2023-01-11 NOTE — ED Provider Notes (Signed)
[facility] Provider Note   CSN: [id number] Arrival date & time: 01/11/23  1403     History  Chief Complaint  Patient presents with   Leg Pain    Erin Prince is a 62 y.o. female.  HPI     62 year old female comes into the emergency room with chief complaint of left lower extremity calf and foot pain.  Patient states that her toe has been hurting her for the last 4 months.  That pain is constant, throbbing.  She has noted intermittent pain to her left lower extremity.  Every night her leg falls asleep.  She is also noted some discomfort over the calf, rest of the leg with ambulation.  Patient has history of 45-pack-year smoking.  She denies any known CAD but has history of stroke.  Patient has not discussed her symptoms with her PCP, but she does not have a PCP appointment till April.  There is no history of DVT, PE.  Patient also takes oral contraceptives as she is post hysterectomy.  Review of system is also positive for sciatica pain, back pain that is chronic in nature for which she is on oxycodone.  Home Medications Prior to Admission medications   Medication Sig Start Date End Date Taking? Authorizing Provider  amitriptyline (ELAVIL) 50 MG tablet Take 50 mg by mouth at bedtime.    [provider]  artificial tears (LACRILUBE) OINT ophthalmic ointment Place into the left eye every 4 (four) hours as needed for dry eyes. 10/23/20   Desiree Hane, MD  cyclobenzaprine (FLEXERIL) 10 MG tablet Take 10 mg by mouth at bedtime. 10/13/20   [provider]  estradiol (ESTRACE) 0.5 MG tablet Take 0.5 mg by mouth daily.    [provider]  gabapentin (NEURONTIN) 300 MG capsule Take 300 mg by mouth 3 (three) times daily.    [provider]  lisinopril (PRINIVIL,ZESTRIL) 20 MG tablet Take 20 mg by mouth daily. 11/29/16   [provider]  nicotine (NICODERM CQ - DOSED IN MG/24 HOURS) 21 mg/24hr patch  Place 1 patch (21 mg total) onto the skin daily. 10/24/20   Desiree Hane, MD  ondansetron (ZOFRAN-ODT) 4 MG disintegrating tablet Take 4 mg by mouth every 6 (six) hours as needed for nausea/vomiting. 10/12/20   [provider]  Oxycodone HCl 10 MG TABS Take 10 mg by mouth every 8 (eight) hours as needed (pain). 10/16/20   [provider]  pantoprazole (PROTONIX) 40 MG tablet Take 40 mg by mouth daily. 11/29/16   [provider]      Allergies    Bactrim [sulfamethoxazole-trimethoprim], Nsaids, and Tramadol    Review of Systems   Review of Systems  All other systems reviewed and are negative.   Physical Exam Updated Vital Signs BP (!) 151/125   Pulse 90   Temp 97.8 F (36.6 C) (Oral)   Resp 20   Ht '5\' 2"'$  (1.575 m)   Wt 74.4 kg   LMP 11/24/2011   SpO2 99%   BMI 30.00 kg/m  Physical Exam Vitals and nursing note reviewed.  Constitutional:      Appearance: She is well-developed.  HENT:     Head: Atraumatic.  Eyes:     Extraocular Movements: Extraocular movements intact.     Pupils: Pupils are equal, round, and reactive to light.  Cardiovascular:     Rate and Rhythm: Normal rate.  Pulmonary:     Effort: Pulmonary  effort is normal.  Musculoskeletal:        General: Tenderness present. No swelling or deformity.     Cervical back: Normal range of motion and neck supple.     Right lower leg: No edema.     Left lower leg: No edema.     Comments: Patient has no unilateral leg swelling.  Skin is warm to touch.  There is no skin discoloration over the either of the lower extremity.  She has no deformity of the left great toe.  Patient able to flex and extend the toe.    Patient has 2+ dorsalis pedis  Skin:    General: Skin is warm and dry.  Neurological:     Mental Status: She is alert and oriented to person, place, and time.     ED Results / Procedures / Treatments   Labs (all labs ordered are listed, but only abnormal results are  displayed) Labs Reviewed  CBC WITH DIFFERENTIAL/PLATELET - Abnormal; Notable for the following components:      Result Value   Lymphs Abs 4.1 (*)    All other components within normal limits  COMPREHENSIVE METABOLIC PANEL - Abnormal; Notable for the following components:   Glucose, Bld 101 (*)    Calcium 8.4 (*)    All other components within normal limits  PROTIME-INR    EKG None  Radiology DG Toe Great Left  Result Date: 01/11/2023 CLINICAL DATA:  Toe pain EXAM: LEFT GREAT TOE three views COMPARISON:  None Available. FINDINGS: There is no evidence of fracture or dislocation. There is no evidence of arthropathy or other focal bone abnormality. Soft tissues are unremarkable. IMPRESSION: No acute osseous abnormality Electronically Signed   By: Jill Side M.D.   On: 01/11/2023 16:17   US Venous Img Lower Unilateral Left  Result Date: 01/11/2023 CLINICAL DATA:  Pain EXAM: LEFT LOWER EXTREMITY VENOUS DOPPLER ULTRASOUND TECHNIQUE: Gray-scale sonography with compression, as well as color and duplex ultrasound, were performed to evaluate the deep venous system(s) from the level of the common femoral vein through the popliteal and proximal calf veins. COMPARISON:  None Available. FINDINGS: VENOUS Normal compressibility of the common femoral, superficial femoral, and popliteal veins, as well as the visualized calf veins. Visualized portions of profunda femoral vein and great saphenous vein unremarkable. No filling defects to suggest DVT on grayscale or color Doppler imaging. Doppler waveforms show normal direction of venous flow, normal respiratory plasticity and response to augmentation. Limited views of the contralateral common femoral vein are unremarkable. OTHER None. Limitations: none IMPRESSION: Negative. Electronically Signed   By: Dorise Bullion III M.D.   On: 01/11/2023 15:07    Procedures Procedures    Medications Ordered in ED Medications  oxyCODONE-acetaminophen (PERCOCET/ROXICET)  5-325 MG per tablet 1 tablet (1 tablet Oral Given 01/11/23 1547)  oxyCODONE (Oxy IR/ROXICODONE) immediate release tablet 10 mg (10 mg Oral Given 01/11/23 1738)    ED Course/ Medical Decision Making/ A&P                             Medical Decision Making Amount and/or Complexity of Data Reviewed Labs: ordered. Radiology: ordered.  Risk Prescription drug management.   62 year old patient comes into the emergency room with chief complaint of left lower extremity pain, toe pain.  She states that every night she has numbness to the left lower extremity and she has been having calf pain and toe pain.  Differential diagnosis considered  for this patient includes pathologic fracture of the toe, thromboembolic event leading to pain of the toe, peripheral arterial disease due to extensive smoking leading to claudication, restless leg syndrome, DVT and cellulitis.  Her exam is overall reassuring.  She has a pulse, there is no signs of infection or deformity.  Ultrasound DVT ordered along with basic labs and also x-ray of the toe to rule out pathologic fracture.  If patient's workup is reassuring in the ER, we will advise that she follows up with vascular surgery.  She will also need to see PCP in case there is concern for restless leg syndrome.  7:22 PM Patient's ultrasound DVT is negative.  I have independently interpreted the x-ray of the toe, there is no evidence of any pathologic fracture. Will advise follow-up with vascular surgery.  Unfortunately her PCP cannot see her until at least April, and it might be worthwhile to get the vascular evaluation done sooner.  Final Clinical Impression(s) / ED Diagnoses Final diagnoses:  Left leg pain  Sciatica of left side  Paresthesias    Rx / DC Orders ED Discharge Orders     None         Varney Biles, MD 01/11/23 [facility]

## 2023-01-20 ENCOUNTER — Ambulatory Visit (HOSPITAL_COMMUNITY): Payer: Medicare HMO | Attending: Vascular Surgery

## 2023-01-20 ENCOUNTER — Other Ambulatory Visit: Payer: Self-pay | Admitting: *Deleted

## 2023-01-20 DIAGNOSIS — M79606 Pain in leg, unspecified: Secondary | ICD-10-CM

## 2023-01-21 ENCOUNTER — Encounter: Payer: Medicare HMO | Admitting: Vascular Surgery

## 2023-03-24 NOTE — Progress Notes (Deleted)
[facility] [place]  ASSESSMENT / PLAN: Erin Prince is a 62 y.o. female with atherosclerosis of *** native arteries of *** causing {Chronic PAD levels:25303}.  Patient counseled {pad risk2:26283}  WIfI score calculated based on clinical exam and non-invasive measurements. {WIFIvascular:26096}  Recommend the following which can slow the progression of atherosclerosis and reduce the risk of major adverse cardiac / limb events:  Complete cessation from all tobacco products. Blood glucose control with goal A1c < 7%. Blood pressure control with goal blood pressure < 140/90 mmHg. Lipid reduction therapy with goal LDL-C <100 mg/dL (<16 if symptomatic from PAD).  Aspirin 81mg  PO QD.  *** Clopidogrel 75mg  PO QD. *** Rivaroxaban 2.5mg  PO BID. *** Cilostozal 100mg  PO BID for intermittent claudication without evidence of heart failure. Atorvastatin 40-80mg  PO QD (or other "high intensity" statin therapy). *** Daily walking to and past the point of discomfort. Patient counseled to keep a log of exercise distance. *** Adequate hydration (at least 2 liters / day) if patient's heart and kidney function is adequate.  Plan *** lower extremity angiogram with possible intervention via *** approach in cath lab ***.    CHIEF COMPLAINT: ***  HISTORY OF PRESENT ILLNESS: Erin Prince is a 62 y.o. female ***  VASCULAR SURGICAL HISTORY: ***  VASCULAR RISK FACTORS: {FINDINGS; POSITIVE NEGATIVE:403 362 5265} history of stroke / transient ischemic attack. {FINDINGS; POSITIVE NEGATIVE:403 362 5265} history of coronary artery disease. *** history of PCI. *** history of CABG.  {FINDINGS; POSITIVE NEGATIVE:403 362 5265} history of diabetes mellitus. Last A1c ***. {FINDINGS; POSITIVE NEGATIVE:403 362 5265} history of smoking. *** actively smoking. {FINDINGS; POSITIVE NEGATIVE:403 362 5265} history of hypertension. *** drug regimen with *** control. {FINDINGS; POSITIVE NEGATIVE:403 362 5265}  history of chronic kidney disease.  Last GFR ***. CKD {stage:30421363}. {FINDINGS; POSITIVE NEGATIVE:403 362 5265} history of chronic obstructive pulmonary disease, treated with ***.  FUNCTIONAL STATUS: ECOG performance status: {findings; ecog performance status:31780} Ambulatory status: {TNHAmbulation:25868}  CAREY 1 AND 3 YEAR INDEX Female (2pts) 75-79 or 80-84 (2pts) >84 (3pts) Dependence in toileting (1pt) Partial or full dependence in dressing (1pt) History of malignant neoplasm (2pts) CHF (3pts) COPD (1pts) CKD (3pts)  0-3 pts 6% 1 year mortality ; 21% 3 year mortality 4-5 pts 12% 1 year mortality ; 36% 3 year mortality >5 pts 21% 1 year mortality; 54% 3 year mortality   Past Medical History:  Diagnosis Date   Cancer (HCC)    COPD (chronic obstructive pulmonary disease) (HCC) 01/05/2017   Depression with anxiety 01/05/2017   GERD (gastroesophageal reflux disease) 01/05/2017   Hyperlipidemia 01/05/2017   Hypertension    Pancreatitis    Tobacco use 10/21/2020    Past Surgical History:  Procedure Laterality Date   ABDOMINAL HYSTERECTOMY     ABDOMINAL SURGERY     CHOLECYSTECTOMY     SMALL INTESTINE SURGERY      Family History  Problem Relation Age of Onset   Diabetes Mellitus II Mother    Lung cancer Father    Kidney disease Sister    Pancreatic disease Neg Hx     Social History   Socioeconomic History   Marital status: Married    Spouse name: Not on file   Number of children: 1   Years of education: Not on file   Highest education level: Not on file  Occupational History   Occupation: unemployed  Tobacco Use   Smoking status: Every Day    Types: Cigarettes   Smokeless tobacco: Never  Substance and Sexual Activity   Alcohol use: No  Drug use: Yes    Types: Marijuana   Sexual activity: Not on file  Other Topics Concern   Not on file  Social History Narrative   Not on file   Social Determinants of Health   Financial Resource Strain: Not on file   Food Insecurity: Not on file  Transportation Needs: Not on file  Physical Activity: Not on file  Stress: Not on file  Social Connections: Not on file  Intimate Partner Violence: Not on file    Allergies  Allergen Reactions   Bactrim [Sulfamethoxazole-Trimethoprim] Anaphylaxis    Anaphylaxis     Nsaids     Other reaction(s): Other (See Comments)   Tramadol     Other reaction(s): Other (See Comments)    Current Outpatient Medications  Medication Sig Dispense Refill   amitriptyline (ELAVIL) 50 MG tablet Take 50 mg by mouth at bedtime.     artificial tears (LACRILUBE) OINT ophthalmic ointment Place into the left eye every 4 (four) hours as needed for dry eyes. 3.5 g 0   cyclobenzaprine (FLEXERIL) 10 MG tablet Take 10 mg by mouth at bedtime.     estradiol (ESTRACE) 0.5 MG tablet Take 0.5 mg by mouth daily.     gabapentin (NEURONTIN) 300 MG capsule Take 300 mg by mouth 3 (three) times daily.     lisinopril (PRINIVIL,ZESTRIL) 20 MG tablet Take 20 mg by mouth daily.  5   nicotine (NICODERM CQ - DOSED IN MG/24 HOURS) 21 mg/24hr patch Place 1 patch (21 mg total) onto the skin daily. 28 patch 0   ondansetron (ZOFRAN-ODT) 4 MG disintegrating tablet Take 4 mg by mouth every 6 (six) hours as needed for nausea/vomiting.     Oxycodone HCl 10 MG TABS Take 10 mg by mouth every 8 (eight) hours as needed (pain).     pantoprazole (PROTONIX) 40 MG tablet Take 40 mg by mouth daily.  5   No current facility-administered medications for this visit.    PHYSICAL EXAM There were no vitals filed for this visit.  Constitutional: *** appearing. *** distress. Appears *** nourished.  Neurologic: CN ***. *** focal findings. *** sensory loss. Psychiatric: *** Mood and affect symmetric and appropriate. Eyes: *** No icterus. No conjunctival pallor. Ears, nose, throat: *** mucous membranes moist. Midline trachea.  Cardiac: *** rate and rhythm.  Respiratory: *** unlabored. Abdominal: *** soft, non-tender,  non-distended.  Peripheral vascular: *** Extremity: *** edema. *** cyanosis. *** pallor.  Skin: *** gangrene. *** ulceration.  Lymphatic: *** Stemmer's sign. *** palpable lymphadenopathy.    PERTINENT LABORATORY AND RADIOLOGIC DATA  Most recent CBC    Latest Ref Rng & Units 01/11/2023    3:54 PM 05/26/2021    3:51 PM 02/14/2021    6:20 PM  CBC  WBC 4.0 - 10.5 K/uL 10.4  8.8  11.0   Hemoglobin 12.0 - 15.0 g/dL 54.0  98.1  19.1   Hematocrit 36.0 - 46.0 % 39.4  39.6  41.9   Platelets 150 - 400 K/uL 301  316  353      Most recent CMP    Latest Ref Rng & Units 01/11/2023    3:54 PM 05/26/2021    3:51 PM 02/14/2021    6:48 PM  CMP  Glucose 70 - 99 mg/dL 478  95  80   BUN 6 - 20 mg/dL 13  11  7    Creatinine 0.44 - 1.00 mg/dL 2.95  6.21  3.08   Sodium 135 - 145 mmol/L 139  138  140   Potassium 3.5 - 5.1 mmol/L 3.7  4.0  3.8   Chloride 98 - 111 mmol/L 105  106  108   CO2 22 - 32 mmol/L 28  25  24    Calcium 8.9 - 10.3 mg/dL 8.4  8.5  8.9   Total Protein 6.5 - 8.1 g/dL 7.0  7.1  7.4   Total Bilirubin 0.3 - 1.2 mg/dL 0.8  0.2  0.3   Alkaline Phos 38 - 126 U/L 84  81  75   AST 15 - 41 U/L 34  17  21   ALT 0 - 44 U/L 44  17  33     Renal function CrCl cannot be calculated (Patient's most recent lab result is older than the maximum 21 days allowed.).  No results found for: "HGBA1C"  No results found for: "LDLCALC", "LDLC", "HIRISKLDL", "POCLDL", "LDLDIRECT", "REALLDLC", "TOTLDLC"   Vascular Imaging: ***  Roy Snuffer N. Lenell Antu, MD FACS [facility] Phone Number: [phone] 03/24/2023 10:22 AM   Total time spent on preparing this encounter including chart review, data review, collecting history, examining the patient, coordinating care for this {tnhtimebilling:26202}  Portions of this report may have been transcribed using voice recognition software.  Every effort has been made to ensure accuracy; however, inadvertent computerized transcription  errors may still be present.

## 2023-03-25 ENCOUNTER — Ambulatory Visit (HOSPITAL_COMMUNITY): Payer: Medicare HMO | Attending: Vascular Surgery

## 2023-03-25 ENCOUNTER — Encounter: Payer: Medicare HMO | Admitting: Vascular Surgery

## 2023-04-22 ENCOUNTER — Encounter: Payer: Medicare HMO | Admitting: Vascular Surgery

## 2023-04-22 ENCOUNTER — Encounter (HOSPITAL_COMMUNITY): Payer: Medicare HMO

## 2023-05-19 ENCOUNTER — Encounter: Payer: Medicare HMO | Admitting: Surgery

## 2023-05-19 ENCOUNTER — Ambulatory Visit (HOSPITAL_COMMUNITY): Admission: RE | Admit: 2023-05-19 | Payer: Medicare HMO | Source: Ambulatory Visit

## 2023-05-19 NOTE — Progress Notes (Unsigned)
[facility] [place]  ASSESSMENT / PLAN: Erin Prince is a 62 y.o. female with left lower extremity pain, paresthesias, numbness.  She has a normal clinical vascular exam.  Her noninvasive studies are normal today.  I counseled her about these benign findings.  I suspect her pain is orthopedic or neurogenic in origin.  She can follow-up with me on an as-needed basis.  CHIEF COMPLAINT: Left leg pain  HISTORY OF PRESENT ILLNESS: Erin Prince is a 62 y.o. female who presents to clinic for evaluation of left leg pain.  This has been ongoing for about 6 months.  The pain radiates from her hip down her leg.  The pain is associated with paresthesias and numbness in the left foot.  She notes a positional component to her pain.  She does not have pain with walking.  The pain is not relieved by stopping walking.  She does not describe classic periods of rest pain.  She has no ulcers about her feet.   Past Medical History:  Diagnosis Date   Cancer (HCC)    COPD (chronic obstructive pulmonary disease) (HCC) 01/05/2017   Depression with anxiety 01/05/2017   GERD (gastroesophageal reflux disease) 01/05/2017   Hyperlipidemia 01/05/2017   Hypertension    Pancreatitis    Tobacco use 10/21/2020    Past Surgical History:  Procedure Laterality Date   ABDOMINAL HYSTERECTOMY     ABDOMINAL SURGERY     CHOLECYSTECTOMY     SMALL INTESTINE SURGERY      Family History  Problem Relation Age of Onset   Diabetes Mellitus II Mother    Lung cancer Father    Kidney disease Sister    Pancreatic disease Neg Hx     Social History   Socioeconomic History   Marital status: Married    Spouse name: Not on file   Number of children: 1   Years of education: Not on file   Highest education level: Not on file  Occupational History   Occupation: unemployed  Tobacco Use   Smoking status: Every Day    Types: Cigarettes   Smokeless tobacco: Never   Tobacco comments:    2 cigs   Substance and Sexual Activity   Alcohol use: No   Drug use: Yes    Types: Marijuana   Sexual activity: Not on file  Other Topics Concern   Not on file  Social History Narrative   Not on file   Social Determinants of Health   Financial Resource Strain: Not on file  Food Insecurity: Not on file  Transportation Needs: Not on file  Physical Activity: Not on file  Stress: Not on file  Social Connections: Not on file  Intimate Partner Violence: Not on file    Allergies  Allergen Reactions   Bactrim [Sulfamethoxazole-Trimethoprim] Anaphylaxis    Anaphylaxis     Nsaids     Other reaction(s): Other (See Comments)   Tramadol     Other reaction(s): Other (See Comments)    Current Outpatient Medications  Medication Sig Dispense Refill   amitriptyline (ELAVIL) 50 MG tablet Take 50 mg by mouth at bedtime.     artificial tears (LACRILUBE) OINT ophthalmic ointment Place into the left eye every 4 (four) hours as needed for dry eyes. 3.5 g 0   cyclobenzaprine (FLEXERIL) 10 MG tablet Take 10 mg by mouth at bedtime.     estradiol (ESTRACE) 0.5 MG tablet Take 0.5 mg by mouth daily.     gabapentin (  NEURONTIN) 300 MG capsule Take 300 mg by mouth 3 (three) times daily.     lisinopril (PRINIVIL,ZESTRIL) 20 MG tablet Take 20 mg by mouth daily.  5   nicotine (NICODERM CQ - DOSED IN MG/24 HOURS) 21 mg/24hr patch Place 1 patch (21 mg total) onto the skin daily. 28 patch 0   ondansetron (ZOFRAN-ODT) 4 MG disintegrating tablet Take 4 mg by mouth every 6 (six) hours as needed for nausea/vomiting.     Oxycodone HCl 10 MG TABS Take 10 mg by mouth every 8 (eight) hours as needed (pain).     pantoprazole (PROTONIX) 40 MG tablet Take 40 mg by mouth daily.  5   No current facility-administered medications for this visit.    PHYSICAL EXAM Vitals:   05/20/23 1432  BP: 116/82  Pulse: 89  Resp: 20  Temp: 98.2 F (36.8 C)  SpO2: 97%  Weight: 163 lb (73.9 kg)  Height: 5\' 2"  (1.575 m)    Middle-age  woman in no distress Regular rate and rhythm Unlabored breathing 2+ dorsalis pedis pulses bilaterally   PERTINENT LABORATORY AND RADIOLOGIC DATA  Most recent CBC    Latest Ref Rng & Units 01/11/2023    3:54 PM 05/26/2021    3:51 PM 02/14/2021    6:20 PM  CBC  WBC 4.0 - 10.5 K/uL 10.4  8.8  11.0   Hemoglobin 12.0 - 15.0 g/dL 16.1  09.6  04.5   Hematocrit 36.0 - 46.0 % 39.4  39.6  41.9   Platelets 150 - 400 K/uL 301  316  353      Most recent CMP    Latest Ref Rng & Units 01/11/2023    3:54 PM 05/26/2021    3:51 PM 02/14/2021    6:48 PM  CMP  Glucose 70 - 99 mg/dL 409  95  80   BUN 6 - 20 mg/dL 13  11  7    Creatinine 0.44 - 1.00 mg/dL 8.11  9.14  7.82   Sodium 135 - 145 mmol/L 139  138  140   Potassium 3.5 - 5.1 mmol/L 3.7  4.0  3.8   Chloride 98 - 111 mmol/L 105  106  108   CO2 22 - 32 mmol/L 28  25  24    Calcium 8.9 - 10.3 mg/dL 8.4  8.5  8.9   Total Protein 6.5 - 8.1 g/dL 7.0  7.1  7.4   Total Bilirubin 0.3 - 1.2 mg/dL 0.8  0.2  0.3   Alkaline Phos 38 - 126 U/L 84  81  75   AST 15 - 41 U/L 34  17  21   ALT 0 - 44 U/L 44  17  33     +-------+-----------+-----------+------------+------------+  ABI/TBIToday's ABIToday's TBIPrevious ABIPrevious TBI  +-------+-----------+-----------+------------+------------+  Right 1.14       0.94                                 +-------+-----------+-----------+------------+------------+  Left  1.06       0.85                                 +-------+-----------+-----------+------------+------------+   Rande Brunt. Lenell Antu, MD [facility] Phone Number: [phone] 05/20/2023 4:16 PM   Total time spent on preparing this encounter including chart review, data review, collecting  history, examining the patient, coordinating care for this new patient, 45 minutes.  Portions of this report may have been transcribed using voice recognition software.  Every effort has been made to ensure  accuracy; however, inadvertent computerized transcription errors may still be present.

## 2023-05-20 ENCOUNTER — Encounter: Payer: Self-pay | Admitting: Vascular Surgery

## 2023-05-20 ENCOUNTER — Ambulatory Visit (HOSPITAL_COMMUNITY)
Admission: RE | Admit: 2023-05-20 | Discharge: 2023-05-20 | Disposition: A | Discharge status: Home / Self Care | Payer: Medicare HMO | Source: Ambulatory Visit | Attending: Vascular Surgery | Admitting: Vascular Surgery

## 2023-05-20 ENCOUNTER — Encounter: Payer: Medicare HMO | Admitting: Vascular Surgery

## 2023-05-20 ENCOUNTER — Ambulatory Visit: Payer: Medicare HMO | Admitting: Vascular Surgery

## 2023-05-20 VITALS — BP 116/82 | HR 89 | Temp 98.2°F | Resp 20 | Ht 62.0 in | Wt 163.0 lb

## 2023-05-20 DIAGNOSIS — M79605 Pain in left leg: Secondary | ICD-10-CM | POA: Diagnosis not present

## 2023-05-20 DIAGNOSIS — M79606 Pain in leg, unspecified: Secondary | ICD-10-CM | POA: Diagnosis present

## 2023-05-20 LAB — VAS US ABI WITH/WO TBI
Left ABI: 1.06
Right ABI: 1.14

## 2024-11-11 ENCOUNTER — Inpatient Hospital Stay (HOSPITAL_BASED_OUTPATIENT_CLINIC_OR_DEPARTMENT_OTHER): Admission: RE | Admit: 2024-11-11 | Source: Ambulatory Visit
